# Patient Record
Sex: Male | Born: 1955
Health system: Southern US, Community
[De-identification: ages and names within clinical notes are randomized; demographics above are authoritative.]

## PROBLEM LIST (undated history)

## (undated) DIAGNOSIS — J189 Pneumonia, unspecified organism: Secondary | ICD-10-CM

## (undated) DIAGNOSIS — G4730 Sleep apnea, unspecified: Secondary | ICD-10-CM

## (undated) DIAGNOSIS — Z8041 Family history of malignant neoplasm of ovary: Secondary | ICD-10-CM

## (undated) DIAGNOSIS — K219 Gastro-esophageal reflux disease without esophagitis: Secondary | ICD-10-CM

## (undated) DIAGNOSIS — C61 Malignant neoplasm of prostate: Secondary | ICD-10-CM

## (undated) DIAGNOSIS — Z8042 Family history of malignant neoplasm of prostate: Secondary | ICD-10-CM

## (undated) HISTORY — PX: OTHER SURGICAL HISTORY: SHX169

## (undated) HISTORY — PX: CHOLECYSTECTOMY: SHX55

## (undated) HISTORY — DX: Family history of malignant neoplasm of ovary: Z80.41

## (undated) HISTORY — DX: Malignant neoplasm of prostate: C61

## (undated) HISTORY — DX: Family history of malignant neoplasm of prostate: Z80.42

---

## 1999-03-11 ENCOUNTER — Inpatient Hospital Stay (HOSPITAL_COMMUNITY): Admission: EM | Admit: 1999-03-11 | Discharge: 1999-03-13 | Payer: Self-pay | Admitting: Emergency Medicine

## 2015-12-28 DIAGNOSIS — L738 Other specified follicular disorders: Secondary | ICD-10-CM | POA: Diagnosis not present

## 2015-12-28 DIAGNOSIS — L570 Actinic keratosis: Secondary | ICD-10-CM | POA: Diagnosis not present

## 2016-03-14 DIAGNOSIS — N529 Male erectile dysfunction, unspecified: Secondary | ICD-10-CM | POA: Diagnosis not present

## 2016-03-14 DIAGNOSIS — I10 Essential (primary) hypertension: Secondary | ICD-10-CM | POA: Diagnosis not present

## 2016-03-14 DIAGNOSIS — E782 Mixed hyperlipidemia: Secondary | ICD-10-CM | POA: Diagnosis not present

## 2016-03-14 DIAGNOSIS — F909 Attention-deficit hyperactivity disorder, unspecified type: Secondary | ICD-10-CM | POA: Diagnosis not present

## 2016-06-11 DIAGNOSIS — L821 Other seborrheic keratosis: Secondary | ICD-10-CM | POA: Diagnosis not present

## 2016-06-11 DIAGNOSIS — L570 Actinic keratosis: Secondary | ICD-10-CM | POA: Diagnosis not present

## 2016-06-11 DIAGNOSIS — D485 Neoplasm of uncertain behavior of skin: Secondary | ICD-10-CM | POA: Diagnosis not present

## 2016-06-11 DIAGNOSIS — Z85828 Personal history of other malignant neoplasm of skin: Secondary | ICD-10-CM | POA: Diagnosis not present

## 2016-09-14 DIAGNOSIS — J0410 Acute tracheitis without obstruction: Secondary | ICD-10-CM | POA: Diagnosis not present

## 2016-09-14 DIAGNOSIS — J069 Acute upper respiratory infection, unspecified: Secondary | ICD-10-CM | POA: Diagnosis not present

## 2016-09-19 DIAGNOSIS — H25043 Posterior subcapsular polar age-related cataract, bilateral: Secondary | ICD-10-CM | POA: Diagnosis not present

## 2016-09-19 DIAGNOSIS — H5213 Myopia, bilateral: Secondary | ICD-10-CM | POA: Diagnosis not present

## 2016-12-04 DIAGNOSIS — E782 Mixed hyperlipidemia: Secondary | ICD-10-CM | POA: Diagnosis not present

## 2016-12-04 DIAGNOSIS — E669 Obesity, unspecified: Secondary | ICD-10-CM | POA: Diagnosis not present

## 2016-12-04 DIAGNOSIS — F909 Attention-deficit hyperactivity disorder, unspecified type: Secondary | ICD-10-CM | POA: Diagnosis not present

## 2016-12-04 DIAGNOSIS — I10 Essential (primary) hypertension: Secondary | ICD-10-CM | POA: Diagnosis not present

## 2017-01-21 DIAGNOSIS — Z125 Encounter for screening for malignant neoplasm of prostate: Secondary | ICD-10-CM | POA: Diagnosis not present

## 2017-01-21 DIAGNOSIS — Z79899 Other long term (current) drug therapy: Secondary | ICD-10-CM | POA: Diagnosis not present

## 2017-01-21 DIAGNOSIS — I10 Essential (primary) hypertension: Secondary | ICD-10-CM | POA: Diagnosis not present

## 2017-01-21 DIAGNOSIS — R6882 Decreased libido: Secondary | ICD-10-CM | POA: Diagnosis not present

## 2017-01-21 DIAGNOSIS — E782 Mixed hyperlipidemia: Secondary | ICD-10-CM | POA: Diagnosis not present

## 2017-01-21 DIAGNOSIS — Z0000 Encounter for general adult medical examination without abnormal findings: Secondary | ICD-10-CM | POA: Diagnosis not present

## 2017-06-11 DIAGNOSIS — L570 Actinic keratosis: Secondary | ICD-10-CM | POA: Diagnosis not present

## 2017-06-11 DIAGNOSIS — D0421 Carcinoma in situ of skin of right ear and external auricular canal: Secondary | ICD-10-CM | POA: Diagnosis not present

## 2017-06-11 DIAGNOSIS — D225 Melanocytic nevi of trunk: Secondary | ICD-10-CM | POA: Diagnosis not present

## 2017-06-11 DIAGNOSIS — D0422 Carcinoma in situ of skin of left ear and external auricular canal: Secondary | ICD-10-CM | POA: Diagnosis not present

## 2017-06-11 DIAGNOSIS — D485 Neoplasm of uncertain behavior of skin: Secondary | ICD-10-CM | POA: Diagnosis not present

## 2017-06-11 DIAGNOSIS — L821 Other seborrheic keratosis: Secondary | ICD-10-CM | POA: Diagnosis not present

## 2017-06-11 DIAGNOSIS — Z85828 Personal history of other malignant neoplasm of skin: Secondary | ICD-10-CM | POA: Diagnosis not present

## 2017-06-25 DIAGNOSIS — D0422 Carcinoma in situ of skin of left ear and external auricular canal: Secondary | ICD-10-CM | POA: Diagnosis not present

## 2017-09-17 DIAGNOSIS — G4700 Insomnia, unspecified: Secondary | ICD-10-CM | POA: Diagnosis not present

## 2017-09-17 DIAGNOSIS — E782 Mixed hyperlipidemia: Secondary | ICD-10-CM | POA: Diagnosis not present

## 2017-09-17 DIAGNOSIS — I10 Essential (primary) hypertension: Secondary | ICD-10-CM | POA: Diagnosis not present

## 2017-09-17 DIAGNOSIS — F909 Attention-deficit hyperactivity disorder, unspecified type: Secondary | ICD-10-CM | POA: Diagnosis not present

## 2017-09-24 DIAGNOSIS — H25043 Posterior subcapsular polar age-related cataract, bilateral: Secondary | ICD-10-CM | POA: Diagnosis not present

## 2017-09-24 DIAGNOSIS — H5213 Myopia, bilateral: Secondary | ICD-10-CM | POA: Diagnosis not present

## 2017-09-24 DIAGNOSIS — H35372 Puckering of macula, left eye: Secondary | ICD-10-CM | POA: Diagnosis not present

## 2017-12-24 DIAGNOSIS — Z85828 Personal history of other malignant neoplasm of skin: Secondary | ICD-10-CM | POA: Diagnosis not present

## 2017-12-24 DIAGNOSIS — L570 Actinic keratosis: Secondary | ICD-10-CM | POA: Diagnosis not present

## 2018-03-18 DIAGNOSIS — I10 Essential (primary) hypertension: Secondary | ICD-10-CM | POA: Diagnosis not present

## 2018-03-18 DIAGNOSIS — E782 Mixed hyperlipidemia: Secondary | ICD-10-CM | POA: Diagnosis not present

## 2018-03-18 DIAGNOSIS — Z0000 Encounter for general adult medical examination without abnormal findings: Secondary | ICD-10-CM | POA: Diagnosis not present

## 2018-03-18 DIAGNOSIS — F909 Attention-deficit hyperactivity disorder, unspecified type: Secondary | ICD-10-CM | POA: Diagnosis not present

## 2018-03-18 DIAGNOSIS — N529 Male erectile dysfunction, unspecified: Secondary | ICD-10-CM | POA: Diagnosis not present

## 2018-06-11 DIAGNOSIS — Z85828 Personal history of other malignant neoplasm of skin: Secondary | ICD-10-CM | POA: Diagnosis not present

## 2018-06-11 DIAGNOSIS — L821 Other seborrheic keratosis: Secondary | ICD-10-CM | POA: Diagnosis not present

## 2018-06-11 DIAGNOSIS — L570 Actinic keratosis: Secondary | ICD-10-CM | POA: Diagnosis not present

## 2018-06-11 DIAGNOSIS — L820 Inflamed seborrheic keratosis: Secondary | ICD-10-CM | POA: Diagnosis not present

## 2018-08-20 HISTORY — PX: COLONOSCOPY: SHX174

## 2018-09-30 DIAGNOSIS — H5213 Myopia, bilateral: Secondary | ICD-10-CM | POA: Diagnosis not present

## 2018-09-30 DIAGNOSIS — H2513 Age-related nuclear cataract, bilateral: Secondary | ICD-10-CM | POA: Diagnosis not present

## 2018-09-30 DIAGNOSIS — H35372 Puckering of macula, left eye: Secondary | ICD-10-CM | POA: Diagnosis not present

## 2019-05-05 DIAGNOSIS — I10 Essential (primary) hypertension: Secondary | ICD-10-CM | POA: Diagnosis not present

## 2019-05-05 DIAGNOSIS — F909 Attention-deficit hyperactivity disorder, unspecified type: Secondary | ICD-10-CM | POA: Diagnosis not present

## 2019-05-20 DIAGNOSIS — I10 Essential (primary) hypertension: Secondary | ICD-10-CM | POA: Diagnosis not present

## 2019-06-17 DIAGNOSIS — D225 Melanocytic nevi of trunk: Secondary | ICD-10-CM | POA: Diagnosis not present

## 2019-06-17 DIAGNOSIS — L821 Other seborrheic keratosis: Secondary | ICD-10-CM | POA: Diagnosis not present

## 2019-06-17 DIAGNOSIS — D485 Neoplasm of uncertain behavior of skin: Secondary | ICD-10-CM | POA: Diagnosis not present

## 2019-06-17 DIAGNOSIS — Z85828 Personal history of other malignant neoplasm of skin: Secondary | ICD-10-CM | POA: Diagnosis not present

## 2019-07-09 DIAGNOSIS — I10 Essential (primary) hypertension: Secondary | ICD-10-CM | POA: Diagnosis not present

## 2019-08-26 DIAGNOSIS — I10 Essential (primary) hypertension: Secondary | ICD-10-CM | POA: Diagnosis not present

## 2019-08-26 DIAGNOSIS — Z0000 Encounter for general adult medical examination without abnormal findings: Secondary | ICD-10-CM | POA: Diagnosis not present

## 2019-08-26 DIAGNOSIS — R7303 Prediabetes: Secondary | ICD-10-CM | POA: Diagnosis not present

## 2019-08-26 DIAGNOSIS — E782 Mixed hyperlipidemia: Secondary | ICD-10-CM | POA: Diagnosis not present

## 2019-08-26 DIAGNOSIS — F909 Attention-deficit hyperactivity disorder, unspecified type: Secondary | ICD-10-CM | POA: Diagnosis not present

## 2019-10-06 DIAGNOSIS — H25043 Posterior subcapsular polar age-related cataract, bilateral: Secondary | ICD-10-CM | POA: Diagnosis not present

## 2019-10-06 DIAGNOSIS — H35372 Puckering of macula, left eye: Secondary | ICD-10-CM | POA: Diagnosis not present

## 2019-10-06 DIAGNOSIS — H5213 Myopia, bilateral: Secondary | ICD-10-CM | POA: Diagnosis not present

## 2019-10-07 DIAGNOSIS — I10 Essential (primary) hypertension: Secondary | ICD-10-CM | POA: Diagnosis not present

## 2019-10-15 ENCOUNTER — Telehealth: Payer: Self-pay | Admitting: *Deleted

## 2019-10-15 NOTE — Telephone Encounter (Signed)
Received referral for patient from PCP Dr Josetta Huddle for HTN clinic with Dr Oval Linsey Left message to call back to get scheduled and find out what pharmacy patient uses

## 2019-10-16 NOTE — Telephone Encounter (Signed)
Moved patient to a HTN clinic appointment

## 2019-10-16 NOTE — Telephone Encounter (Signed)
Patient scheduled for 11/13/19 at 10:40am with Dr. Oval Linsey. Patient uses [facility] at [address], [place], [place]

## 2019-11-12 ENCOUNTER — Telehealth: Payer: Self-pay | Admitting: *Deleted

## 2019-11-12 NOTE — Telephone Encounter (Signed)
Left message for patient to call back, need to verify pharmacy

## 2019-11-13 ENCOUNTER — Ambulatory Visit: Payer: BC Managed Care – PPO | Admitting: Cardiovascular Disease

## 2019-11-13 ENCOUNTER — Other Ambulatory Visit: Payer: Self-pay

## 2019-11-13 ENCOUNTER — Encounter (INDEPENDENT_AMBULATORY_CARE_PROVIDER_SITE_OTHER): Payer: Self-pay

## 2019-11-13 ENCOUNTER — Encounter: Payer: Self-pay | Admitting: Cardiovascular Disease

## 2019-11-13 ENCOUNTER — Other Ambulatory Visit: Payer: Self-pay | Admitting: Cardiovascular Disease

## 2019-11-13 VITALS — BP 180/110 | HR 54 | Ht 71.0 in | Wt 238.0 lb

## 2019-11-13 DIAGNOSIS — E7800 Pure hypercholesterolemia, unspecified: Secondary | ICD-10-CM

## 2019-11-13 DIAGNOSIS — F909 Attention-deficit hyperactivity disorder, unspecified type: Secondary | ICD-10-CM

## 2019-11-13 DIAGNOSIS — I10 Essential (primary) hypertension: Secondary | ICD-10-CM | POA: Insufficient documentation

## 2019-11-13 DIAGNOSIS — F988 Other specified behavioral and emotional disorders with onset usually occurring in childhood and adolescence: Secondary | ICD-10-CM | POA: Insufficient documentation

## 2019-11-13 HISTORY — DX: Other specified behavioral and emotional disorders with onset usually occurring in childhood and adolescence: F98.8

## 2019-11-13 HISTORY — DX: Pure hypercholesterolemia, unspecified: E78.00

## 2019-11-13 HISTORY — DX: Essential (primary) hypertension: I10

## 2019-11-13 MED ORDER — NEBIVOLOL HCL 20 MG PO TABS: 20.0000 mg | ORAL_TABLET | Freq: Every day | ORAL | 1 refills | Status: DC

## 2019-11-13 MED ORDER — CLONIDINE 0.2 MG/24HR TD PTWK: 0.2000 mg | MEDICATED_PATCH | TRANSDERMAL | 12 refills | Status: DC

## 2019-11-13 NOTE — Addendum Note (Signed)
Addended by: Alvina Filbert B on: 11/13/2019 05:54 PM   Modules accepted: Orders

## 2019-11-13 NOTE — Progress Notes (Signed)
Hypertension Clinic Initial Assessment:    Date:  11/13/2019   ID:  Casey Hill, DOB 1955/09/20, MRN [id number]  PCP:  Josetta Huddle, MD  Cardiologist:  No primary care provider on file.  Nephrologist:  Referring MD: Josetta Huddle, MD   CC: Hypertension  History of Present Illness:    Casey Hill is a 64 y.o. male with a hx of hypertension and hyperlipidemia here to establish care in the hypertension clinic.  Casey Hill was first diagnosed with hypertension approximately 15 years ago.  His blood pressure was previously well-controlled.  However in [id number] Bystolic was discontinued.  He was started on metoprolol and his blood pressure was not well have been controlled.  Therefore his PCP switched it to bisoprolol and it still remains elevated.  When he checks it it is typically around [id number] to [id number] systolic.  He walks regularly for 30 minutes most days and has no exertional chest pain or shortness of breath.  He denies lower extremity edema, orthopnea, or PND.  He follows a healthy diet and tries to limit his sodium intake.  He has been limiting carbohydrate intake and eating more salads for lunch.  He limits red meat or pork to once per week.  He mostly cooks at home and does not eat out much.  He has never smoked and has approximately 1 glass of wine nightly.  He takes his morning medications at around 630 or 7 and his evening medications before going to bed.  He was diagnosed with ADD as an adult.  He has been on multiple different stimulant agents.  Most recently he has been taking dextroamphetamine.  He does not take it on the weekends.  Casey Hill reduced the dose to 5 mg daily from 10 mg he notes a difference in his ability to get through his day successfully since reducing the dose.  There has not been a significant change in his blood pressure on 5 mg versus 10 mg.  He does note that his blood pressure is better controlled on the weekends when he does not take it at all.  He does snore  and thinks he may have some apneic episodes.  He mostly feels rested in the mornings when he wakes up.  Previous antihypertensives: metoprolol  Past Medical History:  Diagnosis Date  . ADD (attention deficit disorder) 11/13/2019  . Essential hypertension 11/13/2019  . Pure hypercholesterolemia 11/13/2019    History reviewed. No pertinent surgical history.  Current Medications: Current Meds  Medication Sig  . amLODipine (NORVASC) 5 MG tablet Take 5 mg by mouth daily.  . cholecalciferol (VITAMIN D3) 25 MCG (1000 UNIT) tablet Take 1,000 Units by mouth daily.  Marland Kitchen dextroamphetamine (DEXTROSTAT) 5 MG tablet Take 5 mg by mouth daily.  . rosuvastatin (CRESTOR) 20 MG tablet Take 20 mg by mouth daily.  Marland Kitchen telmisartan-hydrochlorothiazide (MICARDIS HCT) 80-25 MG tablet Take 1 tablet by mouth daily.  Marland Kitchen zolpidem (AMBIEN) 5 MG tablet Take 5 mg by mouth at bedtime as needed for sleep.  . [DISCONTINUED] bisoprolol (ZEBETA) 10 MG tablet Take 10 mg by mouth 2 (two) times daily.  . [DISCONTINUED] cloNIDine (CATAPRES) 0.3 MG tablet Take 0.3 mg by mouth at bedtime.  . [DISCONTINUED] metoprolol succinate (TOPROL-XL) 50 MG 24 hr tablet Take 50 mg by mouth daily.     Allergies:   Patient has no known allergies.   Social History   Socioeconomic History  . Marital status: Married    Spouse name: Not on  file  . Number of children: Not on file  . Years of education: Not on file  . Highest education level: Not on file  Occupational History  . Not on file  Tobacco Use  . Smoking status: Not on file  Substance and Sexual Activity  . Alcohol use: Not on file  . Drug use: Not on file  . Sexual activity: Not on file  Other Topics Concern  . Not on file  Social History Narrative  . Not on file   Social Determinants of Health   Financial Resource Strain:   . Difficulty of Paying Living Expenses:   Food Insecurity:   . Worried About Charity fundraiser in the Last Year:   . Arboriculturist in the Last  Year:   Transportation Needs:   . Film/video editor (Medical):   Marland Kitchen Lack of Transportation (Non-Medical):   Physical Activity:   . Days of Exercise per Week:   . Minutes of Exercise per Session:   Stress:   . Feeling of Stress :   Social Connections:   . Frequency of Communication with Friends and Family:   . Frequency of Social Gatherings with Friends and Family:   . Attends Religious Services:   . Active Member of Clubs or Organizations:   . Attends Archivist Meetings:   Marland Kitchen Marital Status:      Family History: The patient's family history includes Glaucoma in his father and mother; Healthy in his brother; Heart attack in his father; Hyperlipidemia in his father; Hypertension in his father; Prostate cancer in his father; Stroke in his mother.  ROS:   Please see the history of present illness.    All other systems reviewed and are negative.  EKGs/Labs/Other Studies Reviewed:    EKG:  EKG is not ordered today.    Recent Labs: No results found for requested labs within last 8760 hours.   Recent Lipid Panel No results found for: CHOL, TRIG, HDL, CHOLHDL, VLDL, LDLCALC, LDLDIRECT  Physical Exam:    VS:  BP (!) 180/110   Pulse (!) 54   Ht 5\' 11"  (1.803 m)   Wt 238 lb (108 kg)   SpO2 96%   BMI 33.19 kg/m     Wt Readings from Last 3 Encounters:  11/13/19 238 lb (108 kg)    VS:  BP (!) 180/110   Pulse (!) 54   Ht 5\' 11"  (1.803 m)   Wt 238 lb (108 kg)   SpO2 96%   BMI 33.19 kg/m  , BMI Body mass index is 33.19 kg/m. GENERAL:  Well appearing HEENT: Pupils equal round and reactive, fundi not visualized, oral mucosa unremarkable NECK:  No jugular venous distention, waveform within normal limits, carotid upstroke brisk and symmetric, no bruits LUNGS:  Clear to auscultation bilaterally HEART:  RRR.  PMI not displaced or sustained,S1 and S2 within normal limits, no S3, no S4, no clicks, no rubs, no murmurs ABD:  Flat, positive bowel sounds normal in  frequency in pitch, no bruits, no rebound, no guarding, no midline pulsatile mass, no hepatomegaly, no splenomegaly EXT:  2 plus pulses throughout, no edema, no cyanosis no clubbing SKIN:  No rashes no nodules NEURO:  Cranial nerves II through XII grossly intact, motor grossly intact throughout PSYCH:  Cognitively intact, oriented to person place and time   ASSESSMENT:    1. Hypertension, unspecified type   2. Essential hypertension   3. Pure hypercholesterolemia   4. Attention  deficit hyperactivity disorder (ADHD), unspecified ADHD type     PLAN:    # Essential hypertension: Casey Hill's BP was elevated both initially and on repeat.  It is also been elevated at home.  Given that there is no significant change after reducing his ADD medication, I think it is okay to go back to 5 to 10 mg a day.  I suspect that the clonidine at night may be causing him to have some spikes and troughs in his blood pressure.  He gets sleepy with clonidine and does not like taking it during the day.  We will switch this to a clonidine patch 0.2 mg daily.  Continue amlodipine.  His blood pressure has not been well controlled since she stopped Bystolic.  He has tried to beta-blockers and neither worked.  We will now try putting him back on Bystolic 20 mg daily.  He was given a patient co-pay card today.  Continue telmisartan/HCTZ.  He will track his blood pressure at home twice daily.  He was enrolled in our remote patient monitoring system and will be referred for the prep program through the [facility].  We will also check renal artery Dopplers.   # ADD: Increase dextroamphetamine as above.  # Hyperlipidemia: Continue rosuvastatin.   Disposition:    FU with MD/PharmD in 1 month    Medication Adjustments/Labs and Tests Ordered: Current medicines are reviewed at length with the patient today.  Concerns regarding medicines are outlined above.  No orders of the defined types were placed in this encounter.  Meds  ordered this encounter  Medications  . cloNIDine (CATAPRES - DOSED IN MG/24 HR) 0.2 mg/24hr patch    Sig: Place 1 patch (0.2 mg total) onto the skin once a week.    Dispense:  4 patch    Refill:  12    D/C CLONIDINE ORAL  . Nebivolol HCl 20 MG TABS    Sig: Take 1 tablet (20 mg total) by mouth daily.    Dispense:  90 tablet    Refill:  1    D/C BISOPROLOL     Signed, Skeet Latch, MD  11/13/2019 5:30 PM    [place]

## 2019-11-13 NOTE — Patient Instructions (Addendum)
Medication Instructions:  STOP BISOPROLOL   STOP CLONIDINE ORAL MEDICATIONS   START BYSTOLIC 20 MG DAILY   START CLONIDINE 0.2 MG WEEKLY    Labwork: NONE    Testing/Procedures: Your physician has requested that you have a renal artery duplex. During this test, an ultrasound is used to evaluate blood flow to the kidneys. Allow one hour for this exam. Do not eat after midnight the day before and avoid carbonated beverages. Take your medications as you usually do.  Follow-Up: Your physician recommends that you schedule a follow-up appointment in: [place] D 12/16/2019 AT 2:00 PM   Your physician recommends that you schedule a follow-up appointment in: 4 MONTHS WITH DR Cleveland will receive a phone call from the PREP exercise and nutrition program to schedule an initial assessment.  Special Instructions: OK TO INCREASE YOUR DEXTROSTAT TO 10 MG   MONITOR AND LOG YOUR BLOOD PRESSURE TWICE A DAY. BRING YOUR LOG WITH YOU TO FOLLOW UP IN 1 MONTH   [email or web address] FOR COPAY CARD   DASH Eating Plan DASH stands for "Dietary Approaches to Stop Hypertension." The DASH eating plan is a healthy eating plan that has been shown to reduce high blood pressure (hypertension). It may also reduce your risk for type 2 diabetes, heart disease, and stroke. The DASH eating plan may also help with weight loss. What are tips for following this plan?  General guidelines  Avoid eating more than 2,300 mg (milligrams) of salt (sodium) a day. If you have hypertension, you may need to reduce your sodium intake to 1,500 mg a day.  Limit alcohol intake to no more than 1 drink a day for nonpregnant women and 2 drinks a day for men. One drink equals 12 oz of beer, 5 oz of wine, or 1 oz of hard liquor.  Work with your health care provider to maintain a healthy body weight or to lose weight. Ask what an ideal weight is for you.  Get at least 30 minutes of  exercise that causes your heart to beat faster (aerobic exercise) most days of the week. Activities may include walking, swimming, or biking.  Work with your health care provider or diet and nutrition specialist (dietitian) to adjust your eating plan to your individual calorie needs. Reading food labels   Check food labels for the amount of sodium per serving. Choose foods with less than 5 percent of the Daily Value of sodium. Generally, foods with less than 300 mg of sodium per serving fit into this eating plan.  To find whole grains, look for the word "whole" as the first word in the ingredient list. Shopping  Buy products labeled as "low-sodium" or "no salt added."  Buy fresh foods. Avoid canned foods and premade or frozen meals. Cooking  Avoid adding salt when cooking. Use salt-free seasonings or herbs instead of table salt or sea salt. Check with your health care provider or pharmacist before using salt substitutes.  Do not fry foods. Cook foods using healthy methods such as baking, boiling, grilling, and broiling instead.  Cook with heart-healthy oils, such as olive, canola, soybean, or sunflower oil. Meal planning  Eat a balanced diet that includes: ? 5 or more servings of fruits and vegetables each day. At each meal, try to fill half of your plate with fruits and vegetables. ? Up to 6-8 servings of whole grains each day. ? Less than 6 oz of lean meat,  poultry, or fish each day. A 3-oz serving of meat is about the same size as a deck of cards. One egg equals 1 oz. ? 2 servings of low-fat dairy each day. ? A serving of nuts, seeds, or beans 5 times each week. ? Heart-healthy fats. Healthy fats called Omega-3 fatty acids are found in foods such as flaxseeds and coldwater fish, like sardines, salmon, and mackerel.  Limit how much you eat of the following: ? Canned or prepackaged foods. ? Food that is high in trans fat, such as fried foods. ? Food that is high in saturated fat,  such as fatty meat. ? Sweets, desserts, sugary drinks, and other foods with added sugar. ? Full-fat dairy products.  Do not salt foods before eating.  Try to eat at least 2 vegetarian meals each week.  Eat more home-cooked food and less restaurant, buffet, and fast food.  When eating at a restaurant, ask that your food be prepared with less salt or no salt, if possible. What foods are recommended? The items listed may not be a complete list. Talk with your dietitian about what dietary choices are best for you. Grains Whole-grain or whole-wheat bread. Whole-grain or whole-wheat pasta. Brown rice. [place] Morrow. Bulgur. Whole-grain and low-sodium cereals. Pita bread. Low-fat, low-sodium crackers. Whole-wheat flour tortillas. Vegetables Fresh or frozen vegetables (raw, steamed, roasted, or grilled). Low-sodium or reduced-sodium tomato and vegetable juice. Low-sodium or reduced-sodium tomato sauce and tomato paste. Low-sodium or reduced-sodium canned vegetables. Fruits All fresh, dried, or frozen fruit. Canned fruit in natural juice (without added sugar). Meat and other protein foods Skinless chicken or [place]. Ground chicken or [place]. Pork with fat trimmed off. Fish and seafood. Egg whites. Dried beans, peas, or lentils. Unsalted nuts, nut butters, and seeds. Unsalted canned beans. Lean cuts of beef with fat trimmed off. Low-sodium, lean deli meat. Dairy Low-fat (1%) or fat-free (skim) milk. Fat-free, low-fat, or reduced-fat cheeses. Nonfat, low-sodium ricotta or cottage cheese. Low-fat or nonfat yogurt. Low-fat, low-sodium cheese. Fats and oils Soft margarine without trans fats. Vegetable oil. Low-fat, reduced-fat, or light mayonnaise and salad dressings (reduced-sodium). Canola, safflower, olive, soybean, and sunflower oils. Avocado. Seasoning and other foods Herbs. Spices. Seasoning mixes without salt. Unsalted popcorn and pretzels. Fat-free sweets. What foods are not recommended? The  items listed may not be a complete list. Talk with your dietitian about what dietary choices are best for you. Grains Baked goods made with fat, such as croissants, muffins, or some breads. Dry pasta or rice meal packs. Vegetables Creamed or fried vegetables. Vegetables in a cheese sauce. Regular canned vegetables (not low-sodium or reduced-sodium). Regular canned tomato sauce and paste (not low-sodium or reduced-sodium). Regular tomato and vegetable juice (not low-sodium or reduced-sodium). Angie Fava. Olives. Fruits Canned fruit in a light or heavy syrup. Fried fruit. Fruit in cream or butter sauce. Meat and other protein foods Fatty cuts of meat. Ribs. Fried meat. Berniece Salines. Sausage. Bologna and other processed lunch meats. Salami. Fatback. Hotdogs. Bratwurst. Salted nuts and seeds. Canned beans with added salt. Canned or smoked fish. Whole eggs or egg yolks. Chicken or [place] with skin. Dairy Whole or 2% milk, cream, and half-and-half. Whole or full-fat cream cheese. Whole-fat or sweetened yogurt. Full-fat cheese. Nondairy creamers. Whipped toppings. Processed cheese and cheese spreads. Fats and oils Butter. Stick margarine. Lard. Shortening. Ghee. Bacon fat. Tropical oils, such as coconut, palm kernel, or palm oil. Seasoning and other foods Salted popcorn and pretzels. Onion salt, garlic salt, seasoned salt, table salt, and  sea salt. Worcestershire sauce. Tartar sauce. Barbecue sauce. Teriyaki sauce. Soy sauce, including reduced-sodium. Steak sauce. Canned and packaged gravies. Fish sauce. Oyster sauce. Cocktail sauce. Horseradish that you find on the shelf. Ketchup. Mustard. Meat flavorings and tenderizers. Bouillon cubes. Hot sauce and Tabasco sauce. Premade or packaged marinades. Premade or packaged taco seasonings. Relishes. Regular salad dressings. Where to find more information:  National Heart, Lung, and [place]: https://wilson-eaton.com/  American Heart Association:  www.heart.org Summary  The DASH eating plan is a healthy eating plan that has been shown to reduce high blood pressure (hypertension). It may also reduce your risk for type 2 diabetes, heart disease, and stroke.  With the DASH eating plan, you should limit salt (sodium) intake to 2,300 mg a day. If you have hypertension, you may need to reduce your sodium intake to 1,500 mg a day.  When on the DASH eating plan, aim to eat more fresh fruits and vegetables, whole grains, lean proteins, low-fat dairy, and heart-healthy fats.  Work with your health care provider or diet and nutrition specialist (dietitian) to adjust your eating plan to your individual calorie needs. This information is not intended to replace advice given to you by your health care provider. Make sure you discuss any questions you have with your health care provider. Document Released: 07/26/2011 Document Revised: 07/19/2017 Document Reviewed: 07/30/2016 Elsevier Patient Education  2020 [employer].

## 2019-11-13 NOTE — Addendum Note (Signed)
Addended by: Skeet Latch C on: 11/13/2019 05:55 PM   Modules accepted: Orders

## 2019-11-13 NOTE — Telephone Encounter (Signed)
Spoke with patient and he will bring smart phone Per patient he uses [facility] in [place]

## 2019-11-16 ENCOUNTER — Telehealth: Payer: Self-pay | Admitting: Cardiovascular Disease

## 2019-11-16 NOTE — Telephone Encounter (Signed)
Left message for patient to call and schedule renal artery duplex ordered by Dr. Oval Linsey

## 2019-11-17 ENCOUNTER — Telehealth: Payer: Self-pay

## 2019-11-17 NOTE — Telephone Encounter (Signed)
Left message for call back reference referral to [facility].

## 2019-11-18 DIAGNOSIS — Z1159 Encounter for screening for other viral diseases: Secondary | ICD-10-CM | POA: Diagnosis not present

## 2019-11-19 ENCOUNTER — Encounter (HOSPITAL_COMMUNITY): Payer: BC Managed Care – PPO

## 2019-11-23 ENCOUNTER — Telehealth: Payer: Self-pay | Admitting: Cardiovascular Disease

## 2019-11-23 DIAGNOSIS — Z1211 Encounter for screening for malignant neoplasm of colon: Secondary | ICD-10-CM | POA: Diagnosis not present

## 2019-11-23 DIAGNOSIS — D122 Benign neoplasm of ascending colon: Secondary | ICD-10-CM | POA: Diagnosis not present

## 2019-11-23 DIAGNOSIS — D123 Benign neoplasm of transverse colon: Secondary | ICD-10-CM | POA: Diagnosis not present

## 2019-11-23 DIAGNOSIS — D124 Benign neoplasm of descending colon: Secondary | ICD-10-CM | POA: Diagnosis not present

## 2019-11-23 DIAGNOSIS — K5730 Diverticulosis of large intestine without perforation or abscess without bleeding: Secondary | ICD-10-CM | POA: Diagnosis not present

## 2019-11-23 DIAGNOSIS — K635 Polyp of colon: Secondary | ICD-10-CM | POA: Diagnosis not present

## 2019-11-23 NOTE — Telephone Encounter (Signed)
Spoke with patient. Pharmacy reports prior authorization is needed for Bystolic. Will route to primary nurse to complete. Patient would like a call with updates after the prior Josem Kaufmann has been done.

## 2019-11-23 NOTE — Telephone Encounter (Signed)
New Message    Pt is calling and says the pharmacy would not give him the medication BYSTOLIC because they need prior Authorization    Please advise

## 2019-11-23 NOTE — Telephone Encounter (Signed)
BP still running high.  I know we are working on the [employer].  In the meantime, do we have any samples?  If not, let's have him increase amlodipine to 10mg .   ~TCR

## 2019-11-24 NOTE — Telephone Encounter (Signed)
**Note De-Identified Tyronn Golda Obfuscation** FYI: I did the Bystolic PA through covermymeds and received an approval. KEY: [id number] No additional info available.  I did include the [facility] office fax # of [phone] in the [place] PA request with CMM so they can fax the approval letter (if any) to Dr Blenda Mounts nurse.  Can find approval message for Bystolic at [email or web address] or call them at [phone].

## 2019-11-25 ENCOUNTER — Ambulatory Visit (HOSPITAL_COMMUNITY)
Admission: RE | Admit: 2019-11-25 | Discharge: 2019-11-25 | Disposition: A | Discharge status: Home / Self Care | Payer: BC Managed Care – PPO | Source: Ambulatory Visit | Attending: Cardiology | Admitting: Cardiology

## 2019-11-25 ENCOUNTER — Other Ambulatory Visit: Payer: Self-pay

## 2019-11-25 DIAGNOSIS — I10 Essential (primary) hypertension: Secondary | ICD-10-CM | POA: Insufficient documentation

## 2019-11-25 NOTE — Telephone Encounter (Signed)
Thank you very much 

## 2019-11-27 ENCOUNTER — Other Ambulatory Visit: Payer: Self-pay

## 2019-12-02 ENCOUNTER — Telehealth: Payer: Self-pay | Admitting: Cardiovascular Disease

## 2019-12-02 NOTE — Telephone Encounter (Signed)
BP continues to be high on nebivolol.  Increase amlodipine to 10mg .

## 2019-12-09 NOTE — Telephone Encounter (Signed)
Hi Mr. [place],  I'm from the patient accounts dept.  The test you received is legitimate.  The allowed amount for the renal study done 11/25/2019 was applied to your deductible.  Receiving a text means that somewhere along the line you have opted out of paper billing. That can be reinstated by calling [phone], option 2.  You may pay at this number as well.  Please contact us if you have any further questions at [phone], option 7.

## 2019-12-11 ENCOUNTER — Telehealth: Payer: Self-pay | Admitting: *Deleted

## 2019-12-11 NOTE — Telephone Encounter (Signed)
Left message to call back  

## 2019-12-11 NOTE — Telephone Encounter (Signed)
-----   Message from Skeet Latch, MD sent at 12/11/2019  7:49 AM EDT ----- Please have him increase amlodipine to 10mg .  Thanks.

## 2019-12-12 DIAGNOSIS — I10 Essential (primary) hypertension: Secondary | ICD-10-CM | POA: Diagnosis not present

## 2019-12-16 ENCOUNTER — Other Ambulatory Visit: Payer: Self-pay

## 2019-12-16 ENCOUNTER — Ambulatory Visit (INDEPENDENT_AMBULATORY_CARE_PROVIDER_SITE_OTHER): Payer: BC Managed Care – PPO | Admitting: Pharmacist

## 2019-12-16 VITALS — BP 146/88 | HR 60 | Temp 97.5°F | Resp 15 | Ht 71.0 in | Wt 239.0 lb

## 2019-12-16 DIAGNOSIS — I10 Essential (primary) hypertension: Secondary | ICD-10-CM | POA: Diagnosis not present

## 2019-12-16 NOTE — Patient Instructions (Addendum)
Return for a  follow up appointment in 4 weeks  Check your blood pressure at home daily (if able) and keep record of the readings.  Take your BP meds as follows: INCREASE amlodipine to 10mg  daily (okay to take 2 tablets of 5mg  every day until gone)  INCREASE hydration - more water* Decerase sodium in diet with low sodium salad dressing, and low sodium bread*  Bring all of your meds, your BP cuff and your record of home blood pressures to your next appointment.  Exercise as you're able, try to walk approximately 30 minutes per day.  Keep salt intake to a minimum, especially watch canned and prepared boxed foods.  Eat more fresh fruits and vegetables and fewer canned items.  Avoid eating in fast food restaurants.    HOW TO TAKE YOUR BLOOD PRESSURE: . Rest 5 minutes before taking your blood pressure. .  Don't smoke or drink caffeinated beverages for at least 30 minutes before. . Take your blood pressure before (not after) you eat. . Sit comfortably with your back supported and both feet on the floor (don't cross your legs). . Elevate your arm to heart level on a table or a desk. . Use the proper sized cuff. It should fit smoothly and snugly around your bare upper arm. There should be enough room to slip a fingertip under the cuff. The bottom edge of the cuff should be 1 inch above the crease of the elbow. . Ideally, take 3 measurements at one sitting and record the average.

## 2019-12-16 NOTE — Progress Notes (Signed)
Patient ID: Casey Hill                 DOB: [birth year]                      MRN: [id number]     HPI: Casey Hill is a 64 y.o. male referred by Dr. Oval Hill to HTN clinic. PMH includes hypertension for over 15 years, hyperlipidemia, hx of tachycardia, and ADD. Current ADD medication is dextroamphetamine 10mg  daily. Clonidine was changed from 0.2mg  tablets to weekly patch and  Bystolic 20mg  was dded to therapy. Per Vivify monitoring , Dr Casey Hill increased his amlodipine to 10mg  daily, but patient never received notifications and still on amlodipine 5mg  daily.  Noted he is not following a low sodium diet but reports compliance with physical activity and medication therapy.  Current HTN meds:  Amlodipine 5mg  daily  Clonidine 0.2mg  patch every week (Fridays) - 1st dose 3 weeks ago Bystolic 20mg  daily (11/27/2019) -  Telmisartan/HCT 80-25mg  daily (monring)   BP goal: 130/80  Family History: The patient's family history includes Glaucoma in his father and mother; Healthy in his brother; Heart attack in his father; Hyperlipidemia in his father; Hypertension in his father; Prostate cancer in his father; Stroke in his mother.  Social History: denies tobacco use, drink  Diet: healthy diet with limited sodium but continues to eat salads with LOTS of dressing, limited amount of red meat or pork  Exercise: PREP program - not scheduled yet; patient is walking 30 minutes 4x/week at home  Home BP readings:  10 readings; average 158/98 (see vivify report)  Wt Readings from Last 3 Encounters:  12/16/19 239 lb (108.4 kg)  11/13/19 238 lb (108 kg)   BP Readings from Last 3 Encounters:  12/16/19 (!) 146/88  11/13/19 (!) 180/110   Pulse Readings from Last 3 Encounters:  12/16/19 60  11/13/19 (!) 54     Past Medical History:  Diagnosis Date  . ADD (attention deficit disorder) 11/13/2019  . Essential hypertension 11/13/2019  . Pure hypercholesterolemia 11/13/2019    Current Outpatient  Medications on File Prior to Visit  Medication Sig Dispense Refill  . amLODipine (NORVASC) 5 MG tablet Take 10 mg by mouth daily.    . cholecalciferol (VITAMIN D3) 25 MCG (1000 UNIT) tablet Take 1,000 Units by mouth daily.    . cloNIDine (CATAPRES - DOSED IN MG/24 HR) 0.2 mg/24hr patch Place 1 patch (0.2 mg total) onto the skin once a week. 4 patch 12  . dextroamphetamine (DEXTROSTAT) 5 MG tablet Take 5 mg by mouth daily.    . Nebivolol HCl 20 MG TABS Take 1 tablet (20 mg total) by mouth daily. 90 tablet 1  . Omega-3 Fatty Acids (FISH OIL) 1000 MG CAPS Take 1,000 mg by mouth in the morning and at bedtime.    . rosuvastatin (CRESTOR) 20 MG tablet Take 20 mg by mouth daily.    Marland Kitchen telmisartan-hydrochlorothiazide (MICARDIS HCT) 80-25 MG tablet Take 1 tablet by mouth daily.    Marland Kitchen zolpidem (AMBIEN) 5 MG tablet Take 5 mg by mouth at bedtime as needed for sleep.     No current facility-administered medications on file prior to visit.    No Known Allergies  Blood pressure (!) 146/88, pulse 60, temperature (!) 97.5 F (36.4 C), resp. rate 15, height 5\' 11"  (1.803 m), weight 239 lb (108.4 kg), SpO2 96 %.  Essential hypertension Blood pressure remains above goal.. On initial BP measuring we  noticed systolic BP 123456 higher, but after talking and resting for 10 minutes, BP dropped to146/88.   Casey Hill is [facility] and completes his daily assessments with [facility]. Patient stated he uses his arm cuff significantly tighter than in clinic and always check his blood pressure before taking his medication.  We spent significant amount of time discussing appropriate BP measuring, and options to decrease sodium in diet (low sodium bread, low sodium salad dressing, less take out food, and low sodium deli meat). Patient was also encouraged to increase water intake.  Will increase amlodipine dose to 10mg  daily and continue daily monitoring. Plan to add spironolactone 25mg  to his regimen  if additional BP control needed. May consider changing HCTZ to chlorthalidone after adding spironolactone.  Casey Hill PharmD, BCPS, [place] [id number] 12/20/2019 2:56 PM

## 2019-12-17 NOTE — Telephone Encounter (Signed)
Patient seen by Pharm D 4/28 and medication increased

## 2019-12-20 ENCOUNTER — Encounter: Payer: Self-pay | Admitting: Pharmacist

## 2019-12-20 NOTE — Assessment & Plan Note (Signed)
Blood pressure remains above goal.. On initial BP measuring we noticed systolic BP 123456 higher, but after talking and resting for 10 minutes, BP dropped to146/88.   Casey Hill is part of [facility] and completes his daily assessments with [facility]. Patient stated he uses his arm cuff significantly tighter than in clinic and always check his blood pressure before taking his medication.  We spent significant amount of time discussing appropriate BP measuring, and options to decrease sodium in diet (low sodium bread, low sodium salad dressing, less take out food, and low sodium deli meat). Patient was also encouraged to increase water intake.  Will increase amlodipine dose to 10mg  daily and continue daily monitoring. Plan to add spironolactone 25mg  to his regimen if additional BP control needed. May consider changing HCTZ to chlorthalidone after adding spironolactone.

## 2020-01-11 DIAGNOSIS — I10 Essential (primary) hypertension: Secondary | ICD-10-CM | POA: Diagnosis not present

## 2020-01-12 ENCOUNTER — Ambulatory Visit (INDEPENDENT_AMBULATORY_CARE_PROVIDER_SITE_OTHER): Payer: BC Managed Care – PPO | Admitting: Pharmacist

## 2020-01-12 ENCOUNTER — Encounter: Payer: Self-pay | Admitting: Pharmacist

## 2020-01-12 ENCOUNTER — Other Ambulatory Visit: Payer: Self-pay

## 2020-01-12 VITALS — BP 134/78 | HR 40 | Temp 98.0°F | Ht 71.0 in | Wt 235.2 lb

## 2020-01-12 DIAGNOSIS — I10 Essential (primary) hypertension: Secondary | ICD-10-CM

## 2020-01-12 MED ORDER — SPIRONOLACTONE 25 MG PO TABS
25.0000 mg | ORAL_TABLET | Freq: Every day | ORAL | 1 refills | Status: DC
Start: 2020-01-12 — End: 2020-02-16

## 2020-01-12 MED ORDER — AMLODIPINE BESYLATE 10 MG PO TABS: 10.0000 mg | ORAL_TABLET | Freq: Every day | ORAL | 1 refills | Status: DC

## 2020-01-12 MED ORDER — NEBIVOLOL HCL 20 MG PO TABS: 10.0000 mg | ORAL_TABLET | Freq: Every day | ORAL | 1 refills | Status: DC

## 2020-01-12 NOTE — Assessment & Plan Note (Signed)
Blood pressure remains above goal , but significantly improved. Noted baseline K = 3.7 and HR between 40 and low 50s bpm. Will decrease Bystolic to 10mg  daily and add spironolactone 25mg  daily to therapy. Patient is to repeat BMET in 2 week, continue daily BP monitoring , and follow up at HTN clinic in 4 weeks.

## 2020-01-12 NOTE — Patient Instructions (Addendum)
Return for a  follow up appointment in 4 weeks  Go to the lab in 2 weeks  Check your blood pressure at home daily (if able) and keep record of the readings.  Take your BP meds as follows: * DECREASE Bystolic to 10mg  daily - okay to take 1/2 of 20 mg tablet* *START taking spironolactone 25mg  daily*   Bring all of your meds, your BP cuff and your record of home blood pressures to your next appointment.  Exercise as you're able, try to walk approximately 30 minutes per day.  Keep salt intake to a minimum, especially watch canned and prepared boxed foods.  Eat more fresh fruits and vegetables and fewer canned items.  Avoid eating in fast food restaurants.    HOW TO TAKE YOUR BLOOD PRESSURE: . Rest 5 minutes before taking your blood pressure. .  Don't smoke or drink caffeinated beverages for at least 30 minutes before. . Take your blood pressure before (not after) you eat. . Sit comfortably with your back supported and both feet on the floor (don't cross your legs). . Elevate your arm to heart level on a table or a desk. . Use the proper sized cuff. It should fit smoothly and snugly around your bare upper arm. There should be enough room to slip a fingertip under the cuff. The bottom edge of the cuff should be 1 inch above the crease of the elbow. . Ideally, take 3 measurements at one sitting and record the average.

## 2020-01-12 NOTE — Progress Notes (Signed)
Patient ID: PRABHJOT BEGER                 DOB: [birth year]                      MRN: [id number]     HPI: Casey Hill is a 64 y.o. male referred by Dr. Oval Linsey to HTN clinic. PMH includes hypertension for over 15 years, hyperlipidemia, hx of tachycardia, and ADD. Current ADD medication is dextroamphetamine 10mg  daily. Clonidine was changed from 0.2mg  tablets to weekly patch and  Bystolic 20mg  was dded to therapy. Per Vivify monitoring blood pressures are better controlled except for 1 readings of 162/102 on 01/11/2020. Patient explained BP was takend while at work and after lack of sleep. Also noted pulse consistently in the 40s. Patient denies dizziness, increased fatigue, HA's or blurry vision. Reports some swelling of ankles but nothing persistent of bothersome.   Current HTN meds:  Amlodipine 10mg  daily  Clonidine 0.2mg  patch every week  Bystolic 20mg  daily Telmisartan/HCT 80-25mg  daily (AM)    BP goal: 130/80  Family History: The patient's family history includes Glaucoma in his father and mother; Healthy in his brother; Heart attack in his father; Hyperlipidemia in his father; Hypertension in his father; Prostate cancer in his father; Stroke in his mother.  Social History: denies tobacco use, drink  Diet: healthy diet with limited sodium but continues to eat salads with LOTS of dressing, limited amount of red meat or pork  Exercise: PREP program - not scheduled yet; patient is walking 30 minutes 4x/week at home  Home BP readings:  7 readings; average 141/89; HR range 43-53bpm (see vivify report)   Wt Readings from Last 3 Encounters:  01/12/20 235 lb 3.2 oz (106.7 kg)  12/16/19 239 lb (108.4 kg)  11/13/19 238 lb (108 kg)   BP Readings from Last 3 Encounters:  01/12/20 134/78  12/16/19 (!) 146/88  11/13/19 (!) 180/110   Pulse Readings from Last 3 Encounters:  01/12/20 (!) 40  12/16/19 60  11/13/19 (!) 54    Past Medical History:  Diagnosis Date  . ADD (attention  deficit disorder) 11/13/2019  . Essential hypertension 11/13/2019  . Pure hypercholesterolemia 11/13/2019    Current Outpatient Medications on File Prior to Visit  Medication Sig Dispense Refill  . cholecalciferol (VITAMIN D3) 25 MCG (1000 UNIT) tablet Take 1,000 Units by mouth daily.    . cloNIDine (CATAPRES - DOSED IN MG/24 HR) 0.2 mg/24hr patch Place 1 patch (0.2 mg total) onto the skin once a week. 4 patch 12  . dextroamphetamine (DEXTROSTAT) 5 MG tablet Take 5 mg by mouth daily.    . Omega-3 Fatty Acids (FISH OIL) 1000 MG CAPS Take 1,000 mg by mouth in the morning and at bedtime.    . rosuvastatin (CRESTOR) 20 MG tablet Take 20 mg by mouth daily.    Marland Kitchen telmisartan-hydrochlorothiazide (MICARDIS HCT) 80-25 MG tablet Take 1 tablet by mouth daily.    Marland Kitchen zolpidem (AMBIEN) 5 MG tablet Take 5 mg by mouth at bedtime as needed for sleep.     No current facility-administered medications on file prior to visit.    No Known Allergies  Blood pressure 134/78, pulse (!) 40, temperature 98 F (36.7 C), height 5\' 11"  (1.803 m), weight 235 lb 3.2 oz (106.7 kg), SpO2 98 %.  Essential hypertension Blood pressure remains above goal , but significantly improved. Noted baseline K = 3.7 and HR between 40 and low 50s bpm.  Will decrease Bystolic to 10mg  daily and add spironolactone 25mg  daily to therapy. Patient is to repeat BMET in 2 week, continue daily BP monitoring , and follow up at HTN clinic in 4 weeks.    Shalondra Wunschel Rodriguez-Guzman PharmD, BCPS, [place] [id number] 01/12/2020 5:19 PM

## 2020-01-14 DIAGNOSIS — L570 Actinic keratosis: Secondary | ICD-10-CM | POA: Diagnosis not present

## 2020-02-10 DIAGNOSIS — I10 Essential (primary) hypertension: Secondary | ICD-10-CM | POA: Diagnosis not present

## 2020-02-16 ENCOUNTER — Telehealth: Payer: Self-pay

## 2020-02-16 ENCOUNTER — Other Ambulatory Visit: Payer: Self-pay

## 2020-02-16 ENCOUNTER — Ambulatory Visit (INDEPENDENT_AMBULATORY_CARE_PROVIDER_SITE_OTHER): Payer: BC Managed Care – PPO | Admitting: Pharmacist Clinician (PhC)/ Clinical Pharmacy Specialist

## 2020-02-16 VITALS — BP 154/94 | HR 58 | Ht 71.0 in | Wt 233.0 lb

## 2020-02-16 DIAGNOSIS — I10 Essential (primary) hypertension: Secondary | ICD-10-CM | POA: Diagnosis not present

## 2020-02-16 MED ORDER — CLONIDINE 0.2 MG/24HR TD PTWK: 0.2000 mg | MEDICATED_PATCH | TRANSDERMAL | 4 refills | Status: DC

## 2020-02-16 MED ORDER — SPIRONOLACTONE 25 MG PO TABS
50.0000 mg | ORAL_TABLET | Freq: Every day | ORAL | 1 refills | Status: DC
Start: 2020-02-16 — End: 2020-08-23

## 2020-02-16 NOTE — Patient Instructions (Signed)
Return for a a follow up appointment with Dr. Oval Linsey or Coletta Memos - we will call you to schedule that appointment  Go to the lab in 2 weeks   Check your blood pressure at home daily and keep record of the readings.  Take your BP meds as follows:  Increase spironolactone to 50 mg once daily in the mornings.  Move amlodipine 10 mg to evenings   Bring all of your meds, your BP cuff and your record of home blood pressures to your next appointment.  Exercise as you're able, try to walk approximately 30 minutes per day.  Keep salt intake to a minimum, especially watch canned and prepared boxed foods.  Eat more fresh fruits and vegetables and fewer canned items.  Avoid eating in fast food restaurants.    HOW TO TAKE YOUR BLOOD PRESSURE: . Rest 5 minutes before taking your blood pressure. .  Don't smoke or drink caffeinated beverages for at least 30 minutes before. . Take your blood pressure before (not after) you eat. . Sit comfortably with your back supported and both feet on the floor (don't cross your legs). . Elevate your arm to heart level on a table or a desk. . Use the proper sized cuff. It should fit smoothly and snugly around your bare upper arm. There should be enough room to slip a fingertip under the cuff. The bottom edge of the cuff should be 1 inch above the crease of the elbow. . Ideally, take 3 measurements at one sitting and record the average.

## 2020-02-16 NOTE — Progress Notes (Signed)
Patient ID: Casey Hill                 DOB: 06-Jun-1956                      MRN: [id number]     HPI: Casey Hill is a 64 y.o. male referred by Dr. Oval Linsey to HTN clinic. PMH includes hypertension for over 15 years, hyperlipidemia, hx of tachycardia, and ADD. Current ADD medication is dextroamphetamine 10mg  daily. Clonidine was changed from 0.2mg  tablets to weekly patch and  Bystolic 20mg  was dded to therapy. Per Vivify monitoring blood pressures are better controlled except for 1 readings of 162/102 on 01/11/2020. Patient explained BP was takend while at work and after lack of sleep. Also noted pulse consistently in the 40s. Patient denies dizziness, increased fatigue, HA's or blurry vision. Reports some swelling of ankles but nothing persistent of bothersome.   At his last visit with Raquel Rodriguez-Guzman his blood pressure was 134/78.  This was better than his home readings, which at that time averaged 141/89.  Because of his low heart rate (40) the nebivolol was decreased from 20 mg to 10 mg and spironolactone 25 mg daily was added.  Since then he has not noticed a significant change in home readings, other than a slight increase in heart rate.  Denies problems with compliance, did ask for 90 day supply of meds to be sent to mail order pharmacy.   Current HTN meds:  Amlodipine 10mg  daily (am) Clonidine 0.2mg  patch every week (Monday) Bystolic 10mg  daily (dose decreased from 20 mg) (pm) Telmisartan/HCT 80-25mg  daily (am)   Spironolactone 25 mg qd (am)  BP goal: 130/80  Family History: The patient's family history includes Glaucoma in his father and mother; Healthy in his brother; Heart attack in his father; Hyperlipidemia in his father; Hypertension in his father; Prostate cancer in his father; Stroke in his mother.  Social History: denies tobacco use, drinks wine most day  Diet: healthy diet with limited sodium but continues to eat salads with LOTS of dressing, limited amount of  red meat or pork, mor chicken and fish  Exercise: PREP program - not scheduled yet; patient is walking 30 minutes 4x/week at home (had some foot issues, not som much this last week).  Will send message to PREP RN to see if we can get him registered  Home BP readings:  10 readings: average 145/92  (previous visit, 7 day average was 141/89),    Labs:  01/28/20: Na 141, K 4.5, Glu 116, BUN 23, SCr 0.86  10/23/19:  Na 144, K 3.7, Glu 127, BUN 17, SCr 0.85    Wt Readings from Last 3 Encounters:  02/16/20 233 lb (105.7 kg)  01/12/20 235 lb 3.2 oz (106.7 kg)  12/16/19 239 lb (108.4 kg)   BP Readings from Last 3 Encounters:  02/16/20 (!) 154/94  01/12/20 134/78  12/16/19 (!) 146/88   Pulse Readings from Last 3 Encounters:  02/16/20 (!) 58  01/12/20 (!) 40  12/16/19 60    Past Medical History:  Diagnosis Date  . ADD (attention deficit disorder) 11/13/2019  . Essential hypertension 11/13/2019  . Pure hypercholesterolemia 11/13/2019    Current Outpatient Medications on File Prior to Visit  Medication Sig Dispense Refill  . amLODipine (NORVASC) 10 MG tablet Take 1 tablet (10 mg total) by mouth daily. 90 tablet 1  . cholecalciferol (VITAMIN D3) 25 MCG (1000 UNIT) tablet Take 1,000 Units by mouth  daily.    . dextroamphetamine (DEXTROSTAT) 5 MG tablet Take 5 mg by mouth daily.    . Nebivolol HCl 20 MG TABS Take 0.5 tablets (10 mg total) by mouth daily. 90 tablet 1  . Omega-3 Fatty Acids (FISH OIL) 1000 MG CAPS Take 1,000 mg by mouth in the morning and at bedtime.    . rosuvastatin (CRESTOR) 20 MG tablet Take 20 mg by mouth daily.    Marland Kitchen telmisartan-hydrochlorothiazide (MICARDIS HCT) 80-25 MG tablet Take 1 tablet by mouth daily.    Marland Kitchen zolpidem (AMBIEN) 5 MG tablet Take 5 mg by mouth at bedtime as needed for sleep.     No current facility-administered medications on file prior to visit.    No Known Allergies  Blood pressure (!) 154/94, pulse (!) 58, height 5\' 11"  (1.803 m), weight 233 lb  (105.7 kg).  Essential hypertension Patient with poorly controlled hypertension (systolic and diastolic) despite being on 6 medications.  Denies compliance issue or herbal/supplemental medications.  Previous renal dopplers showed no indication of RAS.  Cannot test for hyperaldosteronism while on spironolactone and telmisartan, however there was no decrease in pressure with the addition of 25 mg spironolactone, so would not expect this to be a problem.  Last TSH was 2019, WNL and no health issues that would make me suspect thyroid issues.  Denies sleep issues (other than insomina - uses ambien) or snoring.    Today will increase the spironolactone to 50 mg daily and have him move the amlodipine to evenings for a better balance in his medications.  He has been seen in [facility] 3 times now, so will arrange for him to follow up with either Dr. Oval Linsey or Coletta Memos NP.  Tommy Medal PharmD CPP [facility] [address] [place] [address] 02/16/2020 10:34 AM

## 2020-02-16 NOTE — Assessment & Plan Note (Signed)
Patient with poorly controlled hypertension (systolic and diastolic) despite being on 6 medications.  Denies compliance issue or herbal/supplemental medications.  Previous renal dopplers showed no indication of RAS.  Cannot test for hyperaldosteronism while on spironolactone and telmisartan, however there was no decrease in pressure with the addition of 25 mg spironolactone, so would not expect this to be a problem.  Last TSH was 2019, WNL and no health issues that would make me suspect thyroid issues.  Denies sleep issues (other than insomina - uses ambien) or snoring.    Today will increase the spironolactone to 50 mg daily and have him move the amlodipine to evenings for a better balance in his medications.  He has been seen in [facility] 3 times now, so will arrange for him to follow up with either Dr. Oval Linsey or Coletta Memos NP.

## 2020-02-16 NOTE — Telephone Encounter (Signed)
Call placed to [phone]--listed as mobile number--redirected to mobile number [phone] Patient is interested in PREP. Works during day. Explained program and upcoming class scheduled 03/08/20 at the [facility]. Will be every T/TH from 615p-730 x 12 wks. Agreeable to schedule. Will call back closer to start of class to confirm participation and do intake to set goals. Given number for call back in the event he cannot do 7/20.

## 2020-03-03 ENCOUNTER — Telehealth: Payer: Self-pay | Admitting: *Deleted

## 2020-03-03 NOTE — Telephone Encounter (Signed)
Scheduled follow up and Left message to call back and ask for me or Wilburn Cornelia

## 2020-03-03 NOTE — Telephone Encounter (Signed)
-----   Message from Rockne Menghini, [place] sent at 02/16/2020  3:14 PM EDT ----- Mr. Richison has been seen x 3 with PharmD.  Your turn!  I told him you (or someone) would call to schedule him with either Oval Linsey of [employer]

## 2020-03-04 NOTE — Progress Notes (Signed)
[facility] Report   Patient Details  Name: Casey Hill MRN: [id number] Date of Birth: 1956/05/24 Age: 64 y.o. PCP: Josetta Huddle, MD  Vitals:   03/04/20 1206  BP: (!) 152/94  Pulse: 64  SpO2: 99%  Weight: 235 lb 9.6 oz (106.9 kg)  Height: 5\' 11"  (1.803 m)      [facility] Eval - 03/04/20 1200      Referral    Referring Provider HTN Clinic    Reason for referral Hypertension;Inactivity    Program Start Date 03/08/20   Tues/Thur 615p-730p [place]      Measurement   Neck measurement 17.5 Inches    Waist Circumference 46.5 inches    Body fat 31.3 percent      Information for Trainer   Goals Reduce back pain, exercise more     Current Exercise Walks weekends 2 miles     Orthopedic Concerns Plantar fasciitis     Pertinent Medical History HTN     Current Barriers none    Medications that affect exercise Beta blocker;Medication causing dizziness/drowsiness      Timed Up and Go (TUGS)   Timed Up and Go Low risk <9 seconds      Mobility and Daily Activities   I find it easy to walk up or down two or more flights of stairs. 2    I have no trouble taking out the trash. 4    I do housework such as vacuuming and dusting on my own without difficulty. 4    I can easily lift a gallon of milk (8lbs). 4    I can easily walk a mile. 4    I have no trouble reaching into high cupboards or reaching down to pick up something from the floor. 3    I do not have trouble doing out-door work such as Armed forces logistics/support/administrative officer, raking leaves, or gardening. 3      Mobility and Daily Activities   I feel younger than my age. 3    I feel independent. 4    I feel energetic. 3    I live an active life.  3    I feel strong. 2    I feel healthy. 3    I feel active as other people my age. 2      How fit and strong are you.   Fit and Strong Total Score 44          Past Medical History:  Diagnosis Date  . ADD (attention deficit disorder) 11/13/2019  . Essential hypertension 11/13/2019    . Pure hypercholesterolemia 11/13/2019   No past surgical history on file. Social History   Tobacco Use  Smoking Status Never Smoker  Smokeless Tobacco Never Used    Barnett Hatter 03/04/2020, 12:10 PM

## 2020-03-11 DIAGNOSIS — I10 Essential (primary) hypertension: Secondary | ICD-10-CM | POA: Diagnosis not present

## 2020-03-16 NOTE — Progress Notes (Signed)
[facility] PREP Weekly Session   Patient Details  Name: Casey Hill MRN: [id number] Date of Birth: 09/03/1955 Age: 64 y.o. PCP: Josetta Huddle, MD  There were no vitals filed for this visit.   [facility] Weekly seesion - 03/16/20 1000      Weekly Session   Topic Discussed Other ways to be active    Minutes exercised this week 65 minutes    Classes attended to date 3          Fun things since last meeting: went to dinner at friends Grateful for: family Nutrition celebration: ate fish 4 nights last week Barriers/struggles: chips and ice cream, foot pain Pam Tally Joe 03/16/2020, 10:48 AM

## 2020-03-16 NOTE — Telephone Encounter (Signed)
Left detailed message, ok per DPR  

## 2020-03-22 NOTE — Progress Notes (Signed)
Hypertension Clinic Follow Up:    Date:  05/05/2020   ID:  AMAAR Hill, DOB April 04, 1956, MRN [id number]  PCP:  Josetta Huddle, MD  Cardiologist:  No primary care provider on file.  Nephrologist:  Referring MD: Josetta Huddle, MD   CC: Hypertension  History of Present Illness:    Casey Hill is a 64 y.o. male with a hx of hypertension and hyperlipidemia here for follow-up.  Casey Hill was first diagnosed with hypertension approximately 15 years ago.  His blood pressure was previously well-controlled.  However in 2423 Bystolic was discontinued.  He was started on metoprolol and his blood pressure was not well have been controlled.  Therefore his PCP switched it to bisoprolol and it still remains elevated.  When he checks it it is typically around 536R to 443X systolic.  He walks regularly for 30 minutes most days and has no exertional chest pain or shortness of breath.  He denies lower extremity edema, orthopnea, or PND.  He follows a healthy diet and tries to limit his sodium intake.  He has been limiting carbohydrate intake and eating more salads for lunch.  He limits red meat or pork to once per week.  He mostly cooks at home and does not eat out much.  He has never smoked and has approximately 1 glass of wine nightly.  He takes his morning medications at around 630 or 7 and his evening medications before going to bed.  He was diagnosed with ADD as an adult.  He has been on multiple different stimulant agents.  Most recently he has been taking dextroamphetamine.  He does not take it on the weekends.  Dr. [name] reduced the dose to 5 mg daily from 10 mg he notes a difference in his ability to get through his day successfully since reducing the dose.  There has not been a significant change in his blood pressure on 5 mg versus 10 mg.  He does note that his blood pressure is better controlled on the weekends when he does not take it at all.  He does snore and thinks he may have some apneic  episodes.  He mostly feels rested in the mornings when he wakes up.  At his first appointment clonidine was switched to a patch.  Since his last appointment Mr. Stcyr has been seen by our [facility] HT[facility] pharmacists three times.  He was started on nebivolol and amlodipine was increased.  Bystolic was reduced to 10mg  due to bradycardia.  He notes that the Bystolic did not seem to really affect his blood pressure very much.  Spironolactone was added and then increased to 50mg .  Clonidine was also switched from tablets to a patch.  He was not tested for hyperaldosteronism.  However given that there is no change in his blood pressure after adding spironolactone, this was thought to be unlikely to be the on the underlying cause.  Spironolactone was increased to 50 mg on 6/29.  He has been enrolled in the remote patient monitoring system.  Overall he is seen an improvement in his blood pressures.  He just got back from the beach today.  Today was his first day back at work and he is feeling more stressed.  There was also an announcement that they have to go back to wearing mask because for people in the office had COVID-19.  He is feeling frustrated.  He has been increasing his exercise and working with the prep program at the [facility].  He really enjoys the class.  He also continues to try and limit the sodium in his diet.  Chart reviewed in [place].  [name]'s BP has been ranging from the 130s-150s/80-90s   Previous antihypertensives: Metoprolol Nebivolol  Past Medical History:  Diagnosis Date  . ADD (attention deficit disorder) 11/13/2019  . Essential hypertension 11/13/2019  . Pure hypercholesterolemia 11/13/2019    History reviewed. No pertinent surgical history.  Current Medications: Current Meds  Medication Sig  . amLODipine (NORVASC) 10 MG tablet Take 1 tablet (10 mg total) by mouth daily.  . cholecalciferol (VITAMIN D3) 25 MCG (1000 UNIT) tablet Take 1,000 Units by mouth daily.  . cloNIDine (CATAPRES -  DOSED IN MG/24 HR) 0.2 mg/24hr patch Place 1 patch (0.2 mg total) onto the skin once a week.  Marland Kitchen dextroamphetamine (DEXTROSTAT) 5 MG tablet Take 5 mg by mouth daily. pt taking 10 mg twice a day during week days.  . Omega-3 Fatty Acids (FISH OIL) 1000 MG CAPS Take 1,000 mg by mouth in the morning and at bedtime.  . rosuvastatin (CRESTOR) 20 MG tablet Take 20 mg by mouth daily.  Marland Kitchen spironolactone (ALDACTONE) 25 MG tablet Take 2 tablets (50 mg total) by mouth daily.  Marland Kitchen telmisartan-hydrochlorothiazide (MICARDIS HCT) 80-25 MG tablet Take 1 tablet by mouth daily.  Marland Kitchen zolpidem (AMBIEN) 5 MG tablet Take 5 mg by mouth at bedtime as needed for sleep.  . [DISCONTINUED] Nebivolol HCl 20 MG TABS Take 0.5 tablets (10 mg total) by mouth daily.     Allergies:   Patient has no known allergies.   Social History   Socioeconomic History  . Marital status: Married    Spouse name: Not on file  . Number of children: Not on file  . Years of education: Not on file  . Highest education level: Not on file  Occupational History  . Not on file  Tobacco Use  . Smoking status: Never Smoker  . Smokeless tobacco: Never Used  Substance and Sexual Activity  . Alcohol use: Yes    Alcohol/week: 7.0 standard drinks    Types: 7 Glasses of wine per week  . Drug use: Never  . Sexual activity: Not on file  Other Topics Concern  . Not on file  Social History Narrative  . Not on file   Social Determinants of Health   Financial Resource Strain:   . Difficulty of Paying Living Expenses: Not on file  Food Insecurity:   . Worried About Charity fundraiser in the Last Year: Not on file  . Ran Out of Food in the Last Year: Not on file  Transportation Needs:   . Lack of Transportation (Medical): Not on file  . Lack of Transportation (Non-Medical): Not on file  Physical Activity:   . Days of Exercise per Week: Not on file  . Minutes of Exercise per Session: Not on file  Stress:   . Feeling of Stress : Not on file    Social Connections:   . Frequency of Communication with Friends and Family: Not on file  . Frequency of Social Gatherings with Friends and Family: Not on file  . Attends Religious Services: Not on file  . Active Member of Clubs or Organizations: Not on file  . Attends Archivist Meetings: Not on file  . Marital Status: Not on file     Family History: The patient's family history includes Glaucoma in his father and mother; Healthy in his brother; Heart attack in his  father; Hyperlipidemia in his father; Hypertension in his father; Prostate cancer in his father; Stroke in his mother.  ROS:   Please see the history of present illness.    All other systems reviewed and are negative.  EKGs/Labs/Other Studies Reviewed:    EKG:  EKG is not ordered today.    Recent Labs: No results found for requested labs within last 8760 hours.   Recent Lipid Panel No results found for: CHOL, TRIG, HDL, CHOLHDL, VLDL, LDLCALC, LDLDIRECT  Physical Exam:    VS:  BP (!) 174/92   Pulse 67   Ht 5\' 11"  (1.803 m)   Wt 238 lb (108 kg)   BMI 33.19 kg/m  , BMI Body mass index is 33.19 kg/m. GENERAL:  Well appearing HEENT: Pupils equal round and reactive, fundi not visualized, oral mucosa unremarkable NECK:  No jugular venous distention, waveform within normal limits, carotid upstroke brisk and symmetric, no bruits, no thyromegaly LYMPHATICS:  No cervical adenopathy LUNGS:  Clear to auscultation bilaterally HEART:  RRR.  PMI not displaced or sustained,S1 and S2 within normal limits, no S3, no S4, no clicks, no rubs, no murmurs ABD:  Flat, positive bowel sounds normal in frequency in pitch, no bruits, no rebound, no guarding, no midline pulsatile mass, no hepatomegaly, no splenomegaly EXT:  2 plus pulses throughout, no edema, no cyanosis no clubbing SKIN:  No rashes no nodules NEURO:  Cranial nerves II through XII grossly intact, motor grossly intact throughout PSYCH:  Cognitively intact,  oriented to person place and time   ASSESSMENT:    1. Essential hypertension   2. Attention deficit hyperactivity disorder (ADHD), unspecified ADHD type     PLAN:    # Essential hypertension: Mr. Sheffer's BP remains elevated.  Overall it has been much better controlled.  Today it was high because he has been under a lot of stress.  In general his blood pressure is averaging in the 130s to 140s in the remote patient monitoring system.  He did not get much improvement in his blood pressure after restarting nebivolol.  It is also been quite costly.  We will stop this and switch to doxazosin 4 mg nightly.  Continue amlodipine, clonidine, spironolactone, telmisartan, and HCTZ.  Continue the PREP program and working on his diet.    Renal artery Dopplers were negative 11/2019.  # ADD: Continue dextroamphetamine.  There was no change in his blood pressure when it was reduced or stopped.  # Hyperlipidemia: Continue rosuvastatin.   Disposition:    FU with Gia Lusher C. Oval Linsey, MD, [facility] virtually  in 1 month    Medication Adjustments/Labs and Tests Ordered: Current medicines are reviewed at length with the patient today.  Concerns regarding medicines are outlined above.  No orders of the defined types were placed in this encounter.  Meds ordered this encounter  Medications  . doxazosin (CARDURA) 4 MG tablet    Sig: Take 1 tablet (4 mg total) by mouth at bedtime.    Dispense:  90 tablet    Refill:  1     Signed, Skeet Latch, MD  05/05/2020 1:52 PM    [facility]

## 2020-03-25 ENCOUNTER — Telehealth: Payer: Self-pay

## 2020-03-25 DIAGNOSIS — Z0000 Encounter for general adult medical examination without abnormal findings: Secondary | ICD-10-CM

## 2020-03-25 NOTE — Telephone Encounter (Signed)
Called patient to follow up regarding BP checks with [employer]. Left message for patient to return call to Care Guide at [phone].

## 2020-03-28 ENCOUNTER — Ambulatory Visit (INDEPENDENT_AMBULATORY_CARE_PROVIDER_SITE_OTHER): Payer: BC Managed Care – PPO | Admitting: Cardiovascular Disease

## 2020-03-28 ENCOUNTER — Other Ambulatory Visit: Payer: Self-pay

## 2020-03-28 ENCOUNTER — Encounter: Payer: Self-pay | Admitting: Cardiovascular Disease

## 2020-03-28 VITALS — BP 174/92 | HR 67 | Ht 71.0 in | Wt 238.0 lb

## 2020-03-28 DIAGNOSIS — F909 Attention-deficit hyperactivity disorder, unspecified type: Secondary | ICD-10-CM

## 2020-03-28 DIAGNOSIS — I10 Essential (primary) hypertension: Secondary | ICD-10-CM | POA: Diagnosis not present

## 2020-03-28 MED ORDER — DOXAZOSIN MESYLATE 4 MG PO TABS
4.0000 mg | ORAL_TABLET | Freq: Every day | ORAL | 1 refills | Status: DC
Start: 2020-03-28 — End: 2020-03-28

## 2020-03-28 NOTE — Patient Instructions (Addendum)
Medication Instructions:  STOP BYSTOLIC   START DOXAZOSIN 4 MG AT BEDTIME   Labwork: NONE   Testing/Procedures: NONE   Follow-Up: 05/05/2020 AT 1:30 WITH DR Valmeyer -VIRTUAL VISIT OK

## 2020-03-30 NOTE — Progress Notes (Signed)
[facility] [employer] PREP Weekly Session   Patient Details  Name: Casey Hill MRN: [id number] Date of Birth: 03-13-1956 Age: 64 y.o. PCP: Josetta Huddle, MD  Vitals:   03/29/20 1815  Weight: 237 lb (107.5 kg)     [facility] [employer] Weekly seesion - 03/30/20 1400      Weekly Session   Topic Discussed Health habits    Minutes exercised this week 130 minutes    Classes attended to date 5         Class on 03/29/20  Fun things since last meeting: vacation at [employer] for: family Nutrition celebration: only 1 day of red meat Barriers/struggles: vacation, junk food   Barnett Hatter 03/30/2020, 2:08 PM

## 2020-04-06 NOTE — Progress Notes (Signed)
[facility] PREP Weekly Session   Patient Details  Name: Casey Hill MRN: [id number] Date of Birth: 07-04-1956 Age: 64 y.o. PCP: Josetta Huddle, MD  Vitals:   04/05/20 1815  Weight: 238 lb (108 kg)     [facility] [employer] Weekly seesion - 04/06/20 1000      Weekly Session   Topic Discussed Restaurant Eating;Water    Minutes exercised this week 170 minutes    Classes attended to date 7          Fun things since last meeting: spent weekend at [employer] for: spouse and children Nutrition celebration: beer only 1 day Barriers/struggles: putting in enough true cardio   Barnett Hatter 04/06/2020, 10:51 AM

## 2020-04-10 DIAGNOSIS — I10 Essential (primary) hypertension: Secondary | ICD-10-CM | POA: Diagnosis not present

## 2020-04-13 NOTE — Progress Notes (Signed)
[facility] PREP Weekly Session   Patient Details  Name: Casey Hill MRN: [id number] Date of Birth: 10/24/1955 Age: 64 y.o. PCP: Josetta Huddle, MD  Vitals:   04/12/20 1815  Weight: 238 lb (108 kg)     [facility] [employer] Weekly seesion - 04/13/20 0900      Weekly Session   Topic Discussed Stress management and problem solving    Minutes exercised this week 185 minutes    Classes attended to date 36          Grateful for: weekend at [place] with friends Nutrition celebration: beef 1 day Barriers/struggles: weekend food and maintaining portion sizes   Barnett Hatter 04/13/2020, 9:20 AM

## 2020-05-04 NOTE — Progress Notes (Signed)
[facility] PREP Weekly Session   Patient Details  Name: Casey Hill MRN: [id number] Date of Birth: 30-Aug-1955 Age: 64 y.o. PCP: Josetta Huddle, MD  Vitals:   05/03/20 1815  Weight: 238 lb (108 kg)     [facility] [employer] Weekly seesion - 05/04/20 1100      Weekly Session   Topic Discussed Finding support    Minutes exercised this week 230 minutes    Classes attended to date 85          Fun things since last meeting: spent week at [place] for my spouse Nutrition celebration: a lot of fish and salads Barriers/struggles: tempting sweets   Barnett Hatter 05/04/2020, 11:19 AM

## 2020-05-05 ENCOUNTER — Encounter: Payer: Self-pay | Admitting: Cardiovascular Disease

## 2020-05-05 ENCOUNTER — Telehealth (INDEPENDENT_AMBULATORY_CARE_PROVIDER_SITE_OTHER): Payer: BC Managed Care – PPO | Admitting: Cardiovascular Disease

## 2020-05-05 VITALS — BP 140/83 | Ht 71.0 in | Wt 237.0 lb

## 2020-05-05 DIAGNOSIS — E7800 Pure hypercholesterolemia, unspecified: Secondary | ICD-10-CM | POA: Diagnosis not present

## 2020-05-05 DIAGNOSIS — I10 Essential (primary) hypertension: Secondary | ICD-10-CM

## 2020-05-05 MED ORDER — DOXAZOSIN MESYLATE 4 MG PO TABS: 4.0000 mg | ORAL_TABLET | Freq: Every day | ORAL | 1 refills | Status: DC

## 2020-05-05 MED ORDER — DOXAZOSIN MESYLATE 8 MG PO TABS: 8.0000 mg | ORAL_TABLET | Freq: Every day | ORAL | 3 refills | Status: DC

## 2020-05-05 NOTE — Patient Instructions (Addendum)
Medication Instructions:  INCREASE YOUR DOXAZOSIN TO 8 MG AT BEDTIME   Labwork: NONE  Testing/Procedures: NONE  Follow-Up: VIRTUAL VISIT 1 MONTH 06/06/2020 AT 1:45 PM

## 2020-05-05 NOTE — Progress Notes (Signed)
Hypertension Clinic Virtual Visit via Video Note   This visit type was conducted due to national recommendations for restrictions regarding the COVID-19 Pandemic (e.g. social distancing) in an effort to limit this patient's exposure and mitigate transmission in our community.  Due to his co-morbid illnesses, this patient is at least at moderate risk for complications without adequate follow up.  This format is felt to be most appropriate for this patient at this time.  All issues noted in this document were discussed and addressed.  A limited physical exam was performed with this format.  Please refer to the patient's chart for his consent to telehealth for [facility].     The patient was identified using 2 identifiers.  Date:  05/05/2020   ID:  Casey Hill, DOB [birth year], MRN [id number]  Patient Location: Other:  work Provider Location: Office/Clinic  PCP:  Josetta Huddle, MD  Cardiologist:  No primary care provider on file.  Electrophysiologist:  None   Evaluation Performed:  Follow-Up Visit  Chief Complaint:  hypertension   History of Present Illness:    Casey Hill is a 64 y.o. male with a hx of hypertension and hyperlipidemia here for follow-up.  Casey Hill was first diagnosed with hypertension approximately 15 years ago.  His blood pressure was previously well-controlled.  However in 6979 Bystolic was discontinued.  He was started on metoprolol and his blood pressure was not well have been controlled.  Therefore his PCP switched it to bisoprolol and it still remains elevated.  When he checks it it is typically around 480X to 655V systolic.  He walks regularly for 30 minutes most days and has no exertional chest pain or shortness of breath.  He denies lower extremity edema, orthopnea, or PND.  He follows a healthy diet and tries to limit his sodium intake.  He has been limiting carbohydrate intake and eating more salads for lunch.  He limits red meat or pork to once per  week.  He mostly cooks at home and does not eat out much.  He has never smoked and has approximately 1 glass of wine nightly.  He takes his morning medications at around 630 or 7 and his evening medications before going to bed.  He was diagnosed with ADD as an adult.  He has been on multiple different stimulant agents.  Most recently he has been taking dextroamphetamine.  He does not take it on the weekends.  Dr. [name] reduced the dose to 5 mg daily from 10 mg he notes a difference in his ability to get through his day successfully since reducing the dose.  There has not been a significant change in his blood pressure on 5 mg versus 10 mg.  He does note that his blood pressure is better controlled on the weekends when he does not take it at all.  He does snore and thinks he may have some apneic episodes.  He mostly feels rested in the mornings when he wakes up.  At his first appointment clonidine was switched to a patch.  Since his last appointment Mr. Bossman has been seen by our [facility] HTN pharmacists three times.  He was started on nebivolol and amlodipine was increased.  Bystolic was reduced to 10mg  due to bradycardia.  He notes that the Bystolic did not seem to really affect his blood pressure very much.  Spironolactone was added and then increased to 50mg .  Clonidine was also switched from tablets to a patch.  He was not  tested for hyperaldosteronism.  However given that there is no change in his blood pressure after adding spironolactone, this was thought to be unlikely to be the on the underlying cause.  Spironolactone was increased to 50 mg on 6/29.  He has been enrolled in the remote patient monitoring system.  Overall he is seen an improvement in his blood pressures.  At his last appointment Casey Hill's blood pressure was still elevated and he did not get much improvement on nebivolol.  Given the cost this was discontinued and he was started on doxazosin. Chart reviewed in [place].  Mr. Pankratz's BP is  averaging 136/85.  He is tolerating doxazosin well.  He has been feeling well and exercising regularly. He walks five days per week and goes to the PREP program at the [facility] twice per week.  He is trying to lose weight.  He has no LE edema, orthopnea or PND.  Previous antihypertensives: Metoprolol Nebivolol  Past Medical History:  Diagnosis Date  . ADD (attention deficit disorder) 11/13/2019  . Essential hypertension 11/13/2019  . Pure hypercholesterolemia 11/13/2019    No past surgical history on file.  Current Medications: Current Meds  Medication Sig  . amLODipine (NORVASC) 10 MG tablet Take 1 tablet (10 mg total) by mouth daily.  . cholecalciferol (VITAMIN D3) 25 MCG (1000 UNIT) tablet Take 1,000 Units by mouth daily.  . cloNIDine (CATAPRES - DOSED IN MG/24 HR) 0.2 mg/24hr patch Place 1 patch (0.2 mg total) onto the skin once a week.  Marland Kitchen dextroamphetamine (DEXTROSTAT) 5 MG tablet Take 5 mg by mouth daily. pt taking 10 mg twice a day during week days.  . Omega-3 Fatty Acids (FISH OIL) 1000 MG CAPS Take 1,000 mg by mouth in the morning and at bedtime.  . rosuvastatin (CRESTOR) 20 MG tablet Take 20 mg by mouth daily.  Marland Kitchen spironolactone (ALDACTONE) 25 MG tablet Take 2 tablets (50 mg total) by mouth daily.  Marland Kitchen telmisartan-hydrochlorothiazide (MICARDIS HCT) 80-25 MG tablet Take 1 tablet by mouth daily.  Marland Kitchen zolpidem (AMBIEN) 5 MG tablet Take 5 mg by mouth at bedtime as needed for sleep.  . [DISCONTINUED] doxazosin (CARDURA) 4 MG tablet Take 1 tablet (4 mg total) by mouth at bedtime.     Allergies:   Patient has no known allergies.   Social History   Socioeconomic History  . Marital status: Married    Spouse name: Not on file  . Number of children: Not on file  . Years of education: Not on file  . Highest education level: Not on file  Occupational History  . Not on file  Tobacco Use  . Smoking status: Never Smoker  . Smokeless tobacco: Never Used  Substance and Sexual Activity  .  Alcohol use: Yes    Alcohol/week: 7.0 standard drinks    Types: 7 Glasses of wine per week  . Drug use: Never  . Sexual activity: Not on file  Other Topics Concern  . Not on file  Social History Narrative  . Not on file   Social Determinants of Health   Financial Resource Strain:   . Difficulty of Paying Living Expenses: Not on file  Food Insecurity:   . Worried About Charity fundraiser in the Last Year: Not on file  . Ran Out of Food in the Last Year: Not on file  Transportation Needs:   . Lack of Transportation (Medical): Not on file  . Lack of Transportation (Non-Medical): Not on file  Physical Activity:   .  Days of Exercise per Week: Not on file  . Minutes of Exercise per Session: Not on file  Stress:   . Feeling of Stress : Not on file  Social Connections:   . Frequency of Communication with Friends and Family: Not on file  . Frequency of Social Gatherings with Friends and Family: Not on file  . Attends Religious Services: Not on file  . Active Member of Clubs or Organizations: Not on file  . Attends Archivist Meetings: Not on file  . Marital Status: Not on file     Family History: The patient's family history includes Glaucoma in his father and mother; Healthy in his brother; Heart attack in his father; Hyperlipidemia in his father; Hypertension in his father; Prostate cancer in his father; Stroke in his mother.  ROS:   Please see the history of present illness.    All other systems reviewed and are negative.  EKGs/Labs/Other Studies Reviewed:    EKG:  EKG is not ordered today.    Recent Labs: No results found for requested labs within last 8760 hours.   Recent Lipid Panel No results found for: CHOL, TRIG, HDL, CHOLHDL, VLDL, LDLCALC, LDLDIRECT  Physical Exam:    BP 140/83   Ht 5\' 11"  (1.803 m)   Wt 237 lb (107.5 kg)   BMI 33.05 kg/m  GENERAL: Well-appearing.  No acute distress. HEENT: Pupils equal round.  Oral mucosa unremarkable NECK:   No jugular venous distention, no visible thyromegaly EXT:  No edema, no cyanosis no clubbing SKIN:  No rashes no nodules NEURO:  Speech fluent.  Cranial nerves grossly intact.  Moves all 4 extremities freely PSYCH:  Cognitively intact, oriented to person place and time  ASSESSMENT:    1. Essential hypertension   2. Pure hypercholesterolemia     PLAN:    # Essential hypertension: Mr. Hillegass's BP remains elevated.  Overall it has been much better controlled.  Today it was high because he has been under a lot of stress.  In general his blood pressure is averaging in the 130s to 140s in the remote patient monitoring system.  He did not get much improvement in his blood pressure after restarting nebivolol.  It is also been quite costly.  We will stop this and switch to doxazosin 4 mg nightly.  Continue amlodipine, clonidine, spironolactone, telmisartan, and HCTZ.  Continue the PrEP program and working on his diet.    Renal artery Dopplers were negative 11/2019.  # ADD: Continue dextroamphetamine.  There was no change in his blood pressure when it was reduced or stopped.  # Hyperlipidemia: Continue rosuvastatin.   Disposition:    FU with Galaxy Borden C. Oval Linsey, MD, [facility] virtually  in 1 month    Medication Adjustments/Labs and Tests Ordered: Current medicines are reviewed at length with the patient today.  Concerns regarding medicines are outlined above.  No orders of the defined types were placed in this encounter.  No orders of the defined types were placed in this encounter.   COVID-19 Education: The signs and symptoms of COVID-19 were discussed with the patient and how to seek care for testing (follow up with PCP or arrange E-visit).  The importance of social distancing was discussed today.  Time:   Today, I have spent 15 minutes with the patient with telehealth technology discussing the above problems.     Signed, Skeet Latch, MD  05/05/2020 2:59 PM    [place]

## 2020-05-10 DIAGNOSIS — I10 Essential (primary) hypertension: Secondary | ICD-10-CM | POA: Diagnosis not present

## 2020-05-10 NOTE — Addendum Note (Signed)
Addended by: Mike Craze on: 05/10/2020 03:11 PM   Modules accepted: Orders

## 2020-05-11 NOTE — Progress Notes (Signed)
[facility] [employer] PREP Weekly Session   Patient Details  Name: Casey Hill MRN: [id number] Date of Birth: [birth year] Age: 64 y.o. 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

## 2020-05-25 NOTE — Progress Notes (Signed)
[facility] PREP Weekly Session   Patient Details  Name: Casey Hill MRN: [id number] Date of Birth: 20-Jun-1956 Age: 64 y.o. PCP: Josetta Huddle, MD  Vitals:   05/24/20 1815  Weight: 239 lb (108.4 kg)     [facility] [employer] Weekly seesion - 05/25/20 1000      Weekly Session   Topic Discussed Hitting roadblocks    Minutes exercised this week 190 minutes    Classes attended to date 37          PREP class 05/24/20: Fun things since last meeting: went to see grandkids play ball Grateful for: family Nutrition celebration: salads for lunches Barriers/struggles: desserts   Pam Tally Joe 05/25/2020, 10:37 AM

## 2020-06-01 NOTE — Progress Notes (Signed)
[facility] PREP Weekly Session   Patient Details  Name: Casey Hill MRN: [id number] Date of Birth: 02-17-1956 Age: 64 y.o. PCP: Josetta Huddle, MD  Vitals:   06/01/20 1532  Weight: 239 lb (108.4 kg)     [facility] Weekly seesion - 06/01/20 1500      Weekly Session   Topic Discussed --   final fit testing    Minutes exercised this week 175 minutes    Classes attended to date 19         Class on 05/31/20 at 615pm, late entry [place] things since last meeting: went to see wicked Grateful for: family Nutrition celebration: 2 new fish recipes Barriers/Struggles: family cooking in [place] 06/01/2020, 3:32 PM

## 2020-06-06 ENCOUNTER — Telehealth (INDEPENDENT_AMBULATORY_CARE_PROVIDER_SITE_OTHER): Payer: BC Managed Care – PPO | Admitting: Cardiovascular Disease

## 2020-06-06 ENCOUNTER — Encounter: Payer: Self-pay | Admitting: Cardiovascular Disease

## 2020-06-06 VITALS — BP 137/84 | HR 68 | Ht 71.0 in | Wt 239.0 lb

## 2020-06-06 DIAGNOSIS — I10 Essential (primary) hypertension: Secondary | ICD-10-CM

## 2020-06-06 DIAGNOSIS — E7800 Pure hypercholesterolemia, unspecified: Secondary | ICD-10-CM

## 2020-06-06 NOTE — Patient Instructions (Signed)
Medication Instructions:  Your physician recommends that you continue on your current medications as directed. Please refer to the Current Medication list given to you today.   Labwork: NONE    Testing/Procedures: NONE    Follow-Up: Your physician wants you to follow-up in: [place]  You will receive a reminder letter in the mail two months in advance. If you don't receive a letter, please call our office to schedule the follow-up appointment.

## 2020-06-06 NOTE — Progress Notes (Signed)
Hypertension Clinic Virtual Visit via Video Note   This visit type was conducted due to national recommendations for restrictions regarding the COVID-19 Pandemic (e.g. social distancing) in an effort to limit this patient's exposure and mitigate transmission in our community.  Due to his co-morbid illnesses, this patient is at least at moderate risk for complications without adequate follow up.  This format is felt to be most appropriate for this patient at this time.  All issues noted in this document were discussed and addressed.  A limited physical exam was performed with this format.  Please refer to the patient's chart for his consent to telehealth for [facility].     The patient was identified using 2 identifiers.  Date:  06/06/2020   ID:  Casey Hill, DOB 08-08-1956, MRN [id number]  Patient Location: Other:  work Provider Location: Office/Clinic  PCP:  Josetta Huddle, MD  Cardiologist:  No primary care provider on file.  Electrophysiologist:  None   Evaluation Performed:  Follow-Up Visit  Chief Complaint:  hypertension   History of Present Illness:    Casey Hill is a 64 y.o. male with a hx of hypertension and hyperlipidemia here for follow-up.  Casey Hill was first diagnosed with hypertension approximately 15 years ago.  His blood pressure was previously well-controlled.  However in 3716 Bystolic was discontinued.  He was started on metoprolol and his blood pressure was not well have been controlled.  Therefore his PCP switched it to bisoprolol and it still remains elevated.  When he checks it it is typically around 967E to 938B systolic.  He walks regularly for 30 minutes most days and has no exertional chest pain or shortness of breath.  He denies lower extremity edema, orthopnea, or PND.  He follows a healthy diet and tries to limit his sodium intake.  He has been limiting carbohydrate intake and eating more salads for lunch.  He limits red meat or pork to once per  week.  He mostly cooks at home and does not eat out much.  He has never smoked and has approximately 1 glass of wine nightly.  He takes his morning medications at around 630 or 7 and his evening medications before going to bed.  He was diagnosed with ADD as an adult.  He has been on multiple different stimulant agents.  Most recently he has been taking dextroamphetamine.  He does not take it on the weekends.  Dr. [name] reduced the dose to 5 mg daily from 10 mg he notes a difference in his ability to get through his day successfully since reducing the dose.  There wasn't a significant change in his blood pressure on 5 mg versus 10 mg.  He does note that his blood pressure is better controlled on the weekends when he does not take it at all.    At his first appointment clonidine was switched to a patch.  Since his last appointment Casey Hill has been seen by our [facility] HT[facility] pharmacists three times.  He was started on nebivolol and amlodipine was increased.  Bystolic was reduced to 10mg  due to bradycardia.  He notes that the Bystolic did not seem to really affect his blood pressure very much.  Spironolactone was added and then increased to 50mg .  Clonidine was also switched from tablets to a patch.  He was not tested for hyperaldosteronism.  However given that there is no change in his blood pressure after adding spironolactone, this was thought to be unlikely  to be the on the underlying cause.  Spironolactone was increased to 50 mg on 6/29.  He has been enrolled in the remote patient monitoring system.  Overall he is seen an improvement in his blood pressures.  At his last appointment his blood pressure remained poorly controlled and nebivolol was costly.  This was discontinued and he was started on doxazosin.  His average blood pressure of on remote patient monitoring has been 129/81.  He completed the exercise program at the [facility] and continues to workout 3 days/week.  He has no exertional chest pain or shortness of  breath.  He has been feeling well.  He denies any lower extremity edema, orthopnea, or PND.  Previous antihypertensives: Metoprolol Nebivolol  Past Medical History:  Diagnosis Date   ADD (attention deficit disorder) 11/13/2019   Essential hypertension 11/13/2019   Pure hypercholesterolemia 11/13/2019    History reviewed. No pertinent surgical history.  Current Medications: Current Meds  Medication Sig   amLODipine (NORVASC) 10 MG tablet Take 1 tablet (10 mg total) by mouth daily.   cholecalciferol (VITAMIN D3) 25 MCG (1000 UNIT) tablet Take 1,000 Units by mouth daily.   cloNIDine (CATAPRES - DOSED IN MG/24 HR) 0.2 mg/24hr patch Place 1 patch (0.2 mg total) onto the skin once a week.   dextroamphetamine (DEXTROSTAT) 5 MG tablet Take 10 mg by mouth 2 (two) times daily.    doxazosin (CARDURA) 8 MG tablet Take 1 tablet (8 mg total) by mouth at bedtime.   Omega-3 Fatty Acids (FISH OIL) 1000 MG CAPS Take 1,000 mg by mouth in the morning and at bedtime.   rosuvastatin (CRESTOR) 20 MG tablet Take 20 mg by mouth daily.   telmisartan-hydrochlorothiazide (MICARDIS HCT) 80-25 MG tablet Take 1 tablet by mouth daily.   zolpidem (AMBIEN) 5 MG tablet Take 5 mg by mouth at bedtime as needed for sleep.     Allergies:   Patient has no known allergies.   Social History   Socioeconomic History   Marital status: Married    Spouse name: Not on file   Number of children: Not on file   Years of education: Not on file   Highest education level: Not on file  Occupational History   Not on file  Tobacco Use   Smoking status: Never Smoker   Smokeless tobacco: Never Used  Substance and Sexual Activity   Alcohol use: Yes    Alcohol/week: 7.0 standard drinks    Types: 7 Glasses of wine per week   Drug use: Never   Sexual activity: Not on file  Other Topics Concern   Not on file  Social History Narrative   Not on file   Social Determinants of Health   Financial Resource  Strain:    Difficulty of Paying Living Expenses: Not on file  Food Insecurity:    Worried About [place] in the Last Year: Not on file   Ran Out of Food in the Last Year: Not on file  Transportation Needs:    Lack of Transportation (Medical): Not on file   Lack of Transportation (Non-Medical): Not on file  Physical Activity:    Days of Exercise per Week: Not on file   Minutes of Exercise per Session: Not on file  Stress:    Feeling of Stress : Not on file  Social Connections:    Frequency of Communication with Friends and Family: Not on file   Frequency of Social Gatherings with Friends and Family: Not on file  Attends Religious Services: Not on file   Active Member of Clubs or Organizations: Not on file   Attends Archivist Meetings: Not on file   Marital Status: Not on file     Family History: The patient's family history includes Glaucoma in his father and mother; Healthy in his brother; Heart attack in his father; Hyperlipidemia in his father; Hypertension in his father; Prostate cancer in his father; Stroke in his mother.  ROS:   Please see the history of present illness.    All other systems reviewed and are negative.  EKGs/Labs/Other Studies Reviewed:    EKG:  EKG is not ordered today.    Recent Labs: No results found for requested labs within last 8760 hours.   Recent Lipid Panel No results found for: CHOL, TRIG, HDL, CHOLHDL, VLDL, LDLCALC, LDLDIRECT  Physical Exam:    BP 137/84    Pulse 68    Ht 5\' 11"  (1.803 m)    Wt 239 lb (108.4 kg)    BMI 33.33 kg/m  GENERAL: Well-appearing.  No acute distress. HEENT: Pupils equal round.  Oral mucosa unremarkable NECK:  No jugular venous distention, no visible thyromegaly EXT:  No edema, no cyanosis no clubbing SKIN:  No rashes no nodules NEURO:  Speech fluent.  Cranial nerves grossly intact.  Moves all 4 extremities freely PSYCH:  Cognitively intact, oriented to person place and  time  ASSESSMENT:    1. Essential hypertension   2. Pure hypercholesterolemia     PLAN:    # Essential hypertension: Casey Hill's blood pressures have been controlled.  He will continue his current regimen of amlodipine, clonidine, doxazosin, spironolactone and telmisartan/HCTZ.  Renal artery Dopplers were negative 11/2019.  # ADD: Continue dextroamphetamine.  There was no change in his blood pressure when it was reduced or stopped.  # Hyperlipidemia: Continue rosuvastatin.  Lipids are managed by his PCP.   Disposition:    FU with Ramisa Duman C. Oval Linsey, MD, [facility] virtually  in 6 months   Medication Adjustments/Labs and Tests Ordered: Current medicines are reviewed at length with the patient today.  Concerns regarding medicines are outlined above.  No orders of the defined types were placed in this encounter.  No orders of the defined types were placed in this encounter.   COVID-19 Education: The signs and symptoms of COVID-19 were discussed with the patient and how to seek care for testing (follow up with PCP or arrange E-visit).  The importance of social distancing was discussed today.  Time:   Today, I have spent 17 minutes with the patient with telehealth technology discussing the above problems.     Signed, Skeet Latch, MD  06/06/2020 2:26 PM    [place]

## 2020-06-06 NOTE — Progress Notes (Signed)
[place] Report   Patient Details  Name: Casey Hill MRN: [id number] Date of Birth: Oct 18, 1955 Age: 64 y.o. PCP: Josetta Huddle, MD  Vitals:   06/06/20 1643  BP: 132/84  Pulse: 80  SpO2: 98%  Weight: 237 lb (107.5 kg)      [facility] Eval - 06/06/20 1600      Referral    Reason for referral --   Final measures     Mobility and Daily Activities   I find it easy to walk up or down two or more flights of stairs. 3    I have no trouble taking out the trash. 4    I do housework such as vacuuming and dusting on my own without difficulty. 4    I can easily lift a gallon of milk (8lbs). 4    I can easily walk a mile. 4    I have no trouble reaching into high cupboards or reaching down to pick up something from the floor. 3    I do not have trouble doing out-door work such as Armed forces logistics/support/administrative officer, raking leaves, or gardening. 3      Mobility and Daily Activities   I feel younger than my age. 3    I feel independent. 4    I feel energetic. 3    I live an active life.  3    I feel strong. 3    I feel healthy. 3    I feel active as other people my age. 3      How fit and strong are you.   Fit and Strong Total Score 47          Past Medical History:  Diagnosis Date  . ADD (attention deficit disorder) 11/13/2019  . Essential hypertension 11/13/2019  . Pure hypercholesterolemia 11/13/2019   No past surgical history on file. Social History   Tobacco Use  Smoking Status Never Smoker  Smokeless Tobacco Never Used   Great gains in cardio endurance, improvement in BP. Also improved in strength.  Encouraged continuing to exercise and blocking time on calendar at work to do so.   Barnett Hatter 06/06/2020, 4:45 PM

## 2020-06-16 DIAGNOSIS — Z85828 Personal history of other malignant neoplasm of skin: Secondary | ICD-10-CM | POA: Diagnosis not present

## 2020-06-16 DIAGNOSIS — D225 Melanocytic nevi of trunk: Secondary | ICD-10-CM | POA: Diagnosis not present

## 2020-06-16 DIAGNOSIS — L821 Other seborrheic keratosis: Secondary | ICD-10-CM | POA: Diagnosis not present

## 2020-06-16 DIAGNOSIS — L570 Actinic keratosis: Secondary | ICD-10-CM | POA: Diagnosis not present

## 2020-06-16 DIAGNOSIS — D0421 Carcinoma in situ of skin of right ear and external auricular canal: Secondary | ICD-10-CM | POA: Diagnosis not present

## 2020-07-18 ENCOUNTER — Telehealth: Payer: Self-pay

## 2020-07-18 DIAGNOSIS — Z0000 Encounter for general adult medical examination without abnormal findings: Secondary | ICD-10-CM

## 2020-07-18 NOTE — Telephone Encounter (Signed)
Called the patient to inform him that he completed the 16-week [employer] program and that he does not have to continue tracking his BP in the app. Patient is aware that he is to continue keeping a paper log of his BP readings to bring to his follow up appointments with Dr. Oval Linsey. Patient received his BP cuff from the office.

## 2020-08-03 ENCOUNTER — Other Ambulatory Visit: Payer: Self-pay | Admitting: Cardiovascular Disease

## 2020-08-22 ENCOUNTER — Other Ambulatory Visit: Payer: Self-pay | Admitting: Cardiovascular Disease

## 2020-08-30 DIAGNOSIS — R972 Elevated prostate specific antigen [PSA]: Secondary | ICD-10-CM | POA: Diagnosis not present

## 2020-08-30 DIAGNOSIS — Z0000 Encounter for general adult medical examination without abnormal findings: Secondary | ICD-10-CM | POA: Diagnosis not present

## 2020-08-30 DIAGNOSIS — R7303 Prediabetes: Secondary | ICD-10-CM | POA: Diagnosis not present

## 2020-08-30 DIAGNOSIS — F909 Attention-deficit hyperactivity disorder, unspecified type: Secondary | ICD-10-CM | POA: Diagnosis not present

## 2020-09-13 MED ORDER — EPLERENONE 50 MG PO TABS: 50.0000 mg | ORAL_TABLET | Freq: Every day | ORAL | 1 refills | Status: DC

## 2020-10-11 DIAGNOSIS — H5213 Myopia, bilateral: Secondary | ICD-10-CM | POA: Diagnosis not present

## 2020-10-11 DIAGNOSIS — H25043 Posterior subcapsular polar age-related cataract, bilateral: Secondary | ICD-10-CM | POA: Diagnosis not present

## 2020-10-11 DIAGNOSIS — H35372 Puckering of macula, left eye: Secondary | ICD-10-CM | POA: Diagnosis not present

## 2020-10-11 DIAGNOSIS — H3500 Unspecified background retinopathy: Secondary | ICD-10-CM | POA: Diagnosis not present

## 2020-11-10 DIAGNOSIS — H25812 Combined forms of age-related cataract, left eye: Secondary | ICD-10-CM | POA: Diagnosis not present

## 2020-11-10 DIAGNOSIS — H2512 Age-related nuclear cataract, left eye: Secondary | ICD-10-CM | POA: Diagnosis not present

## 2020-12-05 DIAGNOSIS — N5201 Erectile dysfunction due to arterial insufficiency: Secondary | ICD-10-CM | POA: Diagnosis not present

## 2020-12-05 DIAGNOSIS — R972 Elevated prostate specific antigen [PSA]: Secondary | ICD-10-CM | POA: Diagnosis not present

## 2021-01-31 ENCOUNTER — Other Ambulatory Visit: Payer: Self-pay | Admitting: Cardiovascular Disease

## 2021-03-02 ENCOUNTER — Other Ambulatory Visit: Payer: Self-pay | Admitting: Cardiovascular Disease

## 2021-03-02 NOTE — Telephone Encounter (Signed)
Rx request sent to pharmacy.  

## 2021-03-06 DIAGNOSIS — R972 Elevated prostate specific antigen [PSA]: Secondary | ICD-10-CM | POA: Diagnosis not present

## 2021-03-09 ENCOUNTER — Other Ambulatory Visit: Payer: Self-pay | Admitting: Urology

## 2021-03-09 ENCOUNTER — Other Ambulatory Visit: Payer: Self-pay | Admitting: General Practice

## 2021-03-09 DIAGNOSIS — R972 Elevated prostate specific antigen [PSA]: Secondary | ICD-10-CM

## 2021-04-14 DIAGNOSIS — R972 Elevated prostate specific antigen [PSA]: Secondary | ICD-10-CM | POA: Diagnosis not present

## 2021-04-14 DIAGNOSIS — C61 Malignant neoplasm of prostate: Secondary | ICD-10-CM | POA: Diagnosis not present

## 2021-04-20 DIAGNOSIS — C61 Malignant neoplasm of prostate: Secondary | ICD-10-CM | POA: Diagnosis not present

## 2021-04-27 ENCOUNTER — Other Ambulatory Visit: Payer: Self-pay | Admitting: Cardiovascular Disease

## 2021-05-07 ENCOUNTER — Other Ambulatory Visit: Payer: Self-pay | Admitting: Cardiovascular Disease

## 2021-06-13 DIAGNOSIS — C44329 Squamous cell carcinoma of skin of other parts of face: Secondary | ICD-10-CM | POA: Diagnosis not present

## 2021-06-13 DIAGNOSIS — D485 Neoplasm of uncertain behavior of skin: Secondary | ICD-10-CM | POA: Diagnosis not present

## 2021-06-13 DIAGNOSIS — Z85828 Personal history of other malignant neoplasm of skin: Secondary | ICD-10-CM | POA: Diagnosis not present

## 2021-06-13 DIAGNOSIS — L814 Other melanin hyperpigmentation: Secondary | ICD-10-CM | POA: Diagnosis not present

## 2021-06-13 DIAGNOSIS — D225 Melanocytic nevi of trunk: Secondary | ICD-10-CM | POA: Diagnosis not present

## 2021-06-13 DIAGNOSIS — L821 Other seborrheic keratosis: Secondary | ICD-10-CM | POA: Diagnosis not present

## 2021-06-13 DIAGNOSIS — L570 Actinic keratosis: Secondary | ICD-10-CM | POA: Diagnosis not present

## 2021-06-16 ENCOUNTER — Ambulatory Visit (HOSPITAL_BASED_OUTPATIENT_CLINIC_OR_DEPARTMENT_OTHER): Payer: BC Managed Care – PPO | Admitting: Cardiovascular Disease

## 2021-06-19 ENCOUNTER — Encounter (HOSPITAL_BASED_OUTPATIENT_CLINIC_OR_DEPARTMENT_OTHER): Payer: Self-pay | Admitting: Cardiovascular Disease

## 2021-06-19 ENCOUNTER — Ambulatory Visit (HOSPITAL_BASED_OUTPATIENT_CLINIC_OR_DEPARTMENT_OTHER): Payer: BC Managed Care – PPO | Admitting: Cardiovascular Disease

## 2021-06-19 ENCOUNTER — Other Ambulatory Visit: Payer: Self-pay

## 2021-06-19 DIAGNOSIS — E7800 Pure hypercholesterolemia, unspecified: Secondary | ICD-10-CM

## 2021-06-19 MED ORDER — EPLERENONE 50 MG PO TABS: 50.0000 mg | ORAL_TABLET | Freq: Every day | ORAL | 3 refills | Status: DC

## 2021-06-19 MED ORDER — DOXAZOSIN MESYLATE 8 MG PO TABS
8.0000 mg | ORAL_TABLET | Freq: Every day | ORAL | 3 refills | Status: DC
Start: 2021-06-19 — End: 2022-06-07

## 2021-06-19 MED ORDER — AMLODIPINE BESYLATE 10 MG PO TABS: 10.0000 mg | ORAL_TABLET | Freq: Every day | ORAL | 3 refills | Status: DC

## 2021-06-19 NOTE — Assessment & Plan Note (Signed)
Mr. Charland is doing well in general.  He brings a log of his blood pressures showing that it is been very well have been controlled at home right around 130/80.  He is under a lot of stress right now because his wife's 64 year old father went to the hospital today and he is the caregiver for his 104 year old wife with dementia.  He is critically ill and may not be able to come home.  His wife is out of town and this is likely affecting his blood pressure.  He is going to keep tracking his pressure and if it does not come down at home he will give [place] a call.  Otherwise we will plan for 1 year follow-up given how well it has been controlled prior to this incident.  Continue amlodipine, clonidine, doxazosin, eplerenone, and telmisartan/HCTZ.

## 2021-06-19 NOTE — Assessment & Plan Note (Signed)
Continue rosuvastatin.  Lipids are managed with his primary care provider.

## 2021-06-19 NOTE — Progress Notes (Signed)
Advanced Hypertension Clinic Follow-up:   Date:  07/05/2021   ID:  Casey Hill, DOB 1956/06/16, MRN [id number]  PCP:  Josetta Huddle, MD  Cardiologist:  None  Electrophysiologist:  None   Evaluation Performed:  Follow-Up Visit  Chief Complaint:  hypertension   History of Present Illness:    Casey Hill is a 64 y.o. male with a hx of hypertension and hyperlipidemia here for follow-up.  Casey Hill was first diagnosed with hypertension approximately 15 years ago.  His blood pressure was previously well-controlled.  However in 4174 Bystolic was discontinued.  He was started on metoprolol and his blood pressure was not well have been controlled.  Therefore his PCP switched it to bisoprolol and it still remained elevated. He was diagnosed with ADD as an adult.  He has been on multiple different stimulant agents.  Most recently he has been taking dextroamphetamine.  He does not take it on the weekends.  Dr. [name] reduced the dose to 5 mg daily from 10 mg he notes a difference in his ability to get through his day successfully since reducing the dose.  There wasn't a significant change in his blood pressure on 5 mg versus 10 mg.  He does note that his blood pressure is better controlled on the weekends when he does not take it at all.    At his first appointment clonidine was switched to a patch.  Casey Hill was seen by our [facility] HTN pharmacists three times.  He was started on nebivolol and amlodipine was increased.  Bystolic was reduced to 10mg  due to bradycardia.  He noted that the Bystolic did not seem to really affect his blood pressure very much.  Spironolactone was added and then increased to 50mg .  Clonidine was also switched from tablets to a patch.  He was not tested for hyperaldosteronism.  However given that there is no change in his blood pressure after adding spironolactone, this was thought to be unlikely to be the on the underlying cause.  Spironolactone was increased to 50 mg on  6/29.  He has been enrolled in the remote patient monitoring system.  Overall he had seen an improvement in his blood pressures.  His blood pressure remained poorly controlled and nebivolol was costly.  This was discontinued and he was started on doxazosin.  His average blood pressure of on remote patient monitoring has been 129/81.  He completed the exercise program at the [facility]. At his last appointment his blood pressure was well controlled. Overall, he has been feeling okay. Lately he has been stressed as his father-in-law recently became ill. He presents a blood pressure log, which shows generally stable and well-controlled readings at home. At times he has a little bit of swelling in his LE that typically improves by morning. About 3-4 days a week he tries to exercise by walking (2 miles) or [facility] ([facility] He denies any palpitations, chest pain, or shortness of breath. No lightheadedness, headaches, syncope, orthopnea, PND, or exertional symptoms.  Previous antihypertensives: Metoprolol Nebivolol  Past Medical History:  Diagnosis Date   ADD (attention deficit disorder) 11/13/2019   Essential hypertension 11/13/2019   Pure hypercholesterolemia 11/13/2019    History reviewed. No pertinent surgical history.  Current Medications: Current Meds  Medication Sig   cholecalciferol (VITAMIN D3) 25 MCG (1000 UNIT) tablet Take 1,000 Units by mouth daily.   cloNIDine (CATAPRES - DOSED IN MG/24 HR) 0.2 mg/24hr patch APPLY 1 PATCH ON THE SKIN ONCE  A WEEK  (DISCONTINUE CLONIDINE ORAL TABLETS)   dextroamphetamine (DEXTROSTAT) 5 MG tablet Take 10 mg by mouth 2 (two) times daily.    Omega-3 Fatty Acids (FISH OIL) 1000 MG CAPS Take 1,000 mg by mouth in the morning and at bedtime.   rosuvastatin (CRESTOR) 20 MG tablet Take 20 mg by mouth daily.   telmisartan-hydrochlorothiazide (MICARDIS HCT) 80-25 MG tablet Take 1 tablet by mouth daily.   zolpidem (AMBIEN) 5 MG tablet Take 5 mg by mouth at bedtime  as needed for sleep.   [DISCONTINUED] amLODipine (NORVASC) 10 MG tablet Take 1 tablet (10 mg total) by mouth daily. Please schedule appt for future refills. 1st attempt   [DISCONTINUED] doxazosin (CARDURA) 8 MG tablet TAKE 1 TABLET AT BEDTIME   [DISCONTINUED] eplerenone (INSPRA) 50 MG tablet TAKE 1 TABLET(50 MG) BY MOUTH DAILY     Allergies:   Aldactone [spironolactone]   Social History   Socioeconomic History   Marital status: Married    Spouse name: Not on file   Number of children: Not on file   Years of education: Not on file   Highest education level: Not on file  Occupational History   Not on file  Tobacco Use   Smoking status: Never   Smokeless tobacco: Never  Substance and Sexual Activity   Alcohol use: Yes    Alcohol/week: 7.0 standard drinks    Types: 7 Glasses of wine per week   Drug use: Never   Sexual activity: Not on file  Other Topics Concern   Not on file  Social History Narrative   Not on file   Social Determinants of Health   Financial Resource Strain: Not on file  Food Insecurity: Not on file  Transportation Needs: Not on file  Physical Activity: Not on file  Stress: Not on file  Social Connections: Not on file     Family History: The patient's family history includes Glaucoma in his father and mother; Healthy in his brother; Heart attack in his father; Hyperlipidemia in his father; Hypertension in his father; Prostate cancer in his father; Stroke in his mother.  ROS:   Please see the history of present illness.    (+) Bilateral LE edema All other systems reviewed and are negative.  EKGs/Labs/Other Studies Reviewed:    Renal Artery Duplex 11/25/2019: Summary:  Renal:     Right: No evidence of right renal artery stenosis. RRV flow present.         Cyst(s) noted. Normal size right kidney. Normal right         Resisitive Index. Normal cortical thickness of right kidney.  Left:  No evidence of left renal artery stenosis. LRV flow present.          Normal size of left kidney. Normal left Resistive Index.         Normal cortical thickness of the left kidney.  Mesenteric:  Normal Celiac artery and Superior Mesenteric artery findings.   EKG:   06/19/2021: EKG was not ordered today. 06/06/2020: EKG was not ordered.    Recent Labs: No results found for requested labs within last 8760 hours.   Recent Lipid Panel No results found for: CHOL, TRIG, HDL, CHOLHDL, VLDL, LDLCALC, LDLDIRECT  Physical Exam:    VS:  BP (!) 150/82 (BP Location: Left Arm, Patient Position: Sitting, Cuff Size: Large)   Pulse 91   Ht 5' 11.5" (1.816 m)   Wt 250 lb 11.2 oz (113.7 kg)   SpO2 96%   BMI  34.48 kg/m  , BMI Body mass index is 34.48 kg/m. GENERAL:  Well appearing HEENT: Pupils equal round and reactive, fundi not visualized, oral mucosa unremarkable NECK:  No jugular venous distention, waveform within normal limits, carotid upstroke brisk and symmetric, no bruits, no thyromegaly LUNGS:  Clear to auscultation bilaterally HEART:  RRR.  PMI not displaced or sustained,S1 and S2 within normal limits, no S3, no S4, no clicks, no rubs, no murmurs ABD:  Flat, positive bowel sounds normal in frequency in pitch, no bruits, no rebound, no guarding, no midline pulsatile mass, no hepatomegaly, no splenomegaly EXT:  2 plus pulses throughout, no edema, no cyanosis no clubbing SKIN:  No rashes no nodules NEURO:  Cranial nerves II through XII grossly intact, motor grossly intact throughout PSYCH:  Cognitively intact, oriented to person place and time   ASSESSMENT:    1. Pure hypercholesterolemia      PLAN:    Essential hypertension Mr. Herder is doing well in general.  He brings a log of his blood pressures showing that it is been very well have been controlled at home right around 130/80.  He is under a lot of stress right now because his wife's 75 year old father went to the hospital today and he is the caregiver for his 58 year old wife with dementia.   He is critically ill and may not be able to come home.  His wife is out of town and this is likely affecting his blood pressure.  He is going to keep tracking his pressure and if it does not come down at home he will give [place] a call.  Otherwise we will plan for 1 year follow-up given how well it has been controlled prior to this incident.  Continue amlodipine, clonidine, doxazosin, eplerenone, and telmisartan/HCTZ.  Pure hypercholesterolemia Continue rosuvastatin.  Lipids are managed with his primary care provider.  Disposition:    FU with Bryndle Corredor C. Oval Linsey, MD, [facility] in 1 year.   Medication Adjustments/Labs and Tests Ordered: Current medicines are reviewed at length with the patient today.  Concerns regarding medicines are outlined above.   No orders of the defined types were placed in this encounter.  Meds ordered this encounter  Medications   amLODipine (NORVASC) 10 MG tablet    Sig: Take 1 tablet (10 mg total) by mouth daily.    Dispense:  90 tablet    Refill:  3   eplerenone (INSPRA) 50 MG tablet    Sig: Take 1 tablet (50 mg total) by mouth daily.    Dispense:  90 tablet    Refill:  3   doxazosin (CARDURA) 8 MG tablet    Sig: Take 1 tablet (8 mg total) by mouth at bedtime.    Dispense:  90 tablet    Refill:  3    Needs follow up appointment for further refills    I,Mathew Stumpf,acting as a scribe for Skeet Latch, MD.,have documented all relevant documentation on the behalf of Skeet Latch, MD,as directed by  Skeet Latch, MD while in the presence of Skeet Latch, MD.  I, [place] Oval Linsey, MD have reviewed all documentation for this visit.  The documentation of the exam, diagnosis, procedures, and orders on 07/05/2021 are all accurate and complete.   Signed, Skeet Latch, MD  07/05/2021 1:10 PM    [facility]

## 2021-06-19 NOTE — Patient Instructions (Signed)
Medication Instructions:  ?Your physician recommends that you continue on your current medications as directed. Please refer to the Current Medication list given to you today.  ? ?*If you need a refill on your cardiac medications before your next appointment, please call your pharmacy* ? ?Lab Work: ?NONE ? ?Testing/Procedures: ?NONE ? ?Follow-Up: ?At [facility], you and your health needs are our priority.  As part of our continuing mission to provide you with exceptional heart care, we have created designated Provider Care Teams.  These Care Teams include your primary Cardiologist (physician) and Advanced Practice Providers (APPs -  Physician Assistants and Nurse Practitioners) who all work together to provide you with the care you need, when you need it. ? ?We recommend signing up for the patient portal called "MyChart".  Sign up information is provided on this After Visit Summary.  MyChart is used to connect with patients for Virtual Visits (Telemedicine).  Patients are able to view lab/test results, encounter notes, upcoming appointments, etc.  Non-urgent messages can be sent to your provider as well.   ?To learn more about what you can do with MyChart, go to [email or web address].   ? ?Your next appointment:   ?12 month(s) ? ?The format for your next appointment:   ?In Person ? ?Provider:   ?Tiffany Woodbine, MD  ? ? ? ? ? ? ?

## 2021-07-05 ENCOUNTER — Encounter (HOSPITAL_BASED_OUTPATIENT_CLINIC_OR_DEPARTMENT_OTHER): Payer: Self-pay | Admitting: Cardiovascular Disease

## 2021-07-06 ENCOUNTER — Other Ambulatory Visit (HOSPITAL_BASED_OUTPATIENT_CLINIC_OR_DEPARTMENT_OTHER): Payer: Self-pay | Admitting: Cardiovascular Disease

## 2021-07-06 NOTE — Telephone Encounter (Signed)
Rx(s) sent to pharmacy electronically.  

## 2021-07-15 ENCOUNTER — Encounter (HOSPITAL_BASED_OUTPATIENT_CLINIC_OR_DEPARTMENT_OTHER): Payer: Self-pay | Admitting: Cardiovascular Disease

## 2021-07-17 DIAGNOSIS — H25811 Combined forms of age-related cataract, right eye: Secondary | ICD-10-CM | POA: Diagnosis not present

## 2021-07-17 DIAGNOSIS — H52203 Unspecified astigmatism, bilateral: Secondary | ICD-10-CM | POA: Diagnosis not present

## 2021-07-17 MED ORDER — CLONIDINE 0.2 MG/24HR TD PTWK: MEDICATED_PATCH | TRANSDERMAL | 0 refills | Status: DC

## 2021-07-27 NOTE — Telephone Encounter (Signed)
Patient seen 05/2021

## 2021-08-10 DIAGNOSIS — H25811 Combined forms of age-related cataract, right eye: Secondary | ICD-10-CM | POA: Diagnosis not present

## 2021-08-10 DIAGNOSIS — H2511 Age-related nuclear cataract, right eye: Secondary | ICD-10-CM | POA: Diagnosis not present

## 2021-08-14 ENCOUNTER — Other Ambulatory Visit (HOSPITAL_BASED_OUTPATIENT_CLINIC_OR_DEPARTMENT_OTHER): Payer: Self-pay | Admitting: Cardiovascular Disease

## 2021-09-15 ENCOUNTER — Other Ambulatory Visit: Payer: Self-pay | Admitting: Cardiovascular Disease

## 2021-09-19 DIAGNOSIS — N529 Male erectile dysfunction, unspecified: Secondary | ICD-10-CM | POA: Diagnosis not present

## 2021-09-19 DIAGNOSIS — C61 Malignant neoplasm of prostate: Secondary | ICD-10-CM | POA: Diagnosis not present

## 2021-09-19 DIAGNOSIS — F909 Attention-deficit hyperactivity disorder, unspecified type: Secondary | ICD-10-CM | POA: Diagnosis not present

## 2021-09-19 DIAGNOSIS — R7303 Prediabetes: Secondary | ICD-10-CM | POA: Diagnosis not present

## 2021-09-19 DIAGNOSIS — Z0000 Encounter for general adult medical examination without abnormal findings: Secondary | ICD-10-CM | POA: Diagnosis not present

## 2021-10-05 ENCOUNTER — Ambulatory Visit
Admission: RE | Admit: 2021-10-05 | Discharge: 2021-10-05 | Disposition: A | Discharge status: Home / Self Care | Payer: BC Managed Care – PPO | Source: Ambulatory Visit | Attending: Urology | Admitting: Urology

## 2021-10-05 DIAGNOSIS — R590 Localized enlarged lymph nodes: Secondary | ICD-10-CM | POA: Diagnosis not present

## 2021-10-05 DIAGNOSIS — R972 Elevated prostate specific antigen [PSA]: Secondary | ICD-10-CM | POA: Diagnosis not present

## 2021-10-05 DIAGNOSIS — N400 Enlarged prostate without lower urinary tract symptoms: Secondary | ICD-10-CM | POA: Diagnosis not present

## 2021-10-05 MED ORDER — GADOBENATE DIMEGLUMINE 529 MG/ML IV SOLN
20.0000 mL | Freq: Once | INTRAVENOUS | Status: AC | PRN
  Administered 2021-10-05: 20 mL via INTRAVENOUS

## 2021-10-06 ENCOUNTER — Other Ambulatory Visit: Payer: BC Managed Care – PPO

## 2021-10-13 DIAGNOSIS — R0683 Snoring: Secondary | ICD-10-CM | POA: Diagnosis not present

## 2021-10-13 DIAGNOSIS — R0681 Apnea, not elsewhere classified: Secondary | ICD-10-CM | POA: Diagnosis not present

## 2021-10-13 DIAGNOSIS — G478 Other sleep disorders: Secondary | ICD-10-CM | POA: Diagnosis not present

## 2021-10-16 DIAGNOSIS — C61 Malignant neoplasm of prostate: Secondary | ICD-10-CM | POA: Diagnosis not present

## 2021-10-23 DIAGNOSIS — C61 Malignant neoplasm of prostate: Secondary | ICD-10-CM | POA: Diagnosis not present

## 2021-10-30 ENCOUNTER — Other Ambulatory Visit: Payer: Self-pay | Admitting: Urology

## 2021-10-30 DIAGNOSIS — G4733 Obstructive sleep apnea (adult) (pediatric): Secondary | ICD-10-CM | POA: Diagnosis not present

## 2021-11-05 ENCOUNTER — Other Ambulatory Visit (HOSPITAL_BASED_OUTPATIENT_CLINIC_OR_DEPARTMENT_OTHER): Payer: Self-pay | Admitting: Cardiovascular Disease

## 2021-11-06 NOTE — Telephone Encounter (Signed)
Rx(s) sent to pharmacy electronically.  

## 2021-11-08 DIAGNOSIS — G4733 Obstructive sleep apnea (adult) (pediatric): Secondary | ICD-10-CM | POA: Diagnosis not present

## 2021-11-09 DIAGNOSIS — R278 Other lack of coordination: Secondary | ICD-10-CM | POA: Diagnosis not present

## 2021-11-10 ENCOUNTER — Other Ambulatory Visit (HOSPITAL_COMMUNITY): Payer: Self-pay | Admitting: Urology

## 2021-11-10 ENCOUNTER — Other Ambulatory Visit: Payer: Self-pay | Admitting: Urology

## 2021-11-10 DIAGNOSIS — C61 Malignant neoplasm of prostate: Secondary | ICD-10-CM

## 2021-11-10 NOTE — Progress Notes (Addendum)
DUE TO COVID-19 ONLY ONE VISITOR IS ALLOWED TO COME WITH YOU AND STAY IN THE WAITING ROOM ONLY DURING PRE OP AND PROCEDURE DAY OF SURGERY.  2 VISITOR  MAY VISIT WITH YOU AFTER SURGERY IN YOUR PRIVATE ROOM DURING VISITING HOURS ONLY! ?YOU MAY HAVE ONE PERSON SPEND THE NITE WITH YOU IN YOUR ROOM AFTER SURGERY.   ? ?YG.  ? ? Your procedure is scheduled on:  ? 12/04/2021.  ? Report to [facility] Main  Entrance ? ? Report to admitting at     0515           AM ?DO NOT [place], PICTURE ID OR WALLET DAY OF SURGERY.  ?  ? ? Call this number if you have problems the morning of surgery [phone]  ?Clear liquid diet the day before surgery.   ? ?DISSOLVE 17 grams  OF MIRALAX IN 4 OUNCES OF WATER AND DRINK AT 12 NOON DAY BEFORE SURGERY.   ? ?FLEETS ENEMA NITE BEFORE SURGERY   ? REMEMBER: NO  SOLID FOODS , CANDY, GUM OR MINTS AFTER MIDNITE THE NITE BEFORE SURGERY .       Marland Kitchen CLEAR LIQUIDS UNTIL   0415am             DAY OF SURGERY.     ? ? ?CLEAR LIQUID DIET ? ? ?Foods Allowed      ?WATER ?BLACK COFFEE ( SUGAR OK, NO MILK, CREAM OR CREAMER) REGULAR AND DECAF  ?TEA ( SUGAR OK NO MILK, CREAM, OR CREAMER) REGULAR AND DECAF  ?PLAIN JELLO ( NO RED)  ?FRUIT ICES ( NO RED, NO FRUIT PULP)  ?POPSICLES ( NO RED)  ?JUICE- APPLE, WHITE GRAPE AND WHITE CRANBERRY  ?SPORT DRINK LIKE GATORADE ( NO RED)  ?CLEAR BROTH ( VEGETABLE , CHICKEN OR BEEF)                                                               ? ?    ? ?BRUSH YOUR TEETH MORNING OF SURGERY AND RINSE YOUR MOUTH OUT, NO CHEWING GUM CANDY OR MINTS. ?  ? ? Take these medicines the morning of surgery with A SIP OF WATER:  AMLODIPINE, INSPRA  ? ? ?DO NOT TAKE ANY DIABETIC MEDICATIONS DAY OF YOUR SURGERY ?                  ?            You may not have any metal on your body including hair pins and  ?            piercings  Do not wear jewelry, make-up, lotions, powders or perfumes, deodorant ?            Do not wear nail polish on your fingernails.   ?           IF YOU  ARE A MALE AND WANT TO SHAVE UNDER ARMS OR LEGS PRIOR TO SURGERY YOU MUST DO SO AT LEAST 48 HOURS PRIOR TO SURGERY.  ?            Men may shave face and neck. ? ? Do not bring valuables to the hospital. [facility] NOT ?  RESPONSIBLE   FOR VALUABLES. ? Contacts, dentures or bridgework may not be worn into surgery. ? Leave suitcase in the car. After surgery it may be brought to your room. ? ?  ? Patients discharged the day of surgery will not be allowed to drive home. IF YOU ARE HAVING SURGERY AND GOING HOME THE SAME DAY, YOU MUST HAVE AN ADULT TO DRIVE YOU HOME AND BE WITH YOU FOR 24 HOURS. YOU MAY GO HOME BY TAXI OR UBER OR ORTHERWISE, BUT AN ADULT MUST ACCOMPANY YOU HOME AND STAY WITH YOU FOR 24 HOURS. ?  ? ?            Please read over the following fact sheets you were given: ?_____________________________________________________________________ ? ?[facility] - Preparing for Surgery ?Before surgery, you can play an important role.  Because skin is not sterile, your skin needs to be as free of germs as possible.  You can reduce the number of germs on your skin by washing with CHG (chlorahexidine gluconate) soap before surgery.  CHG is an antiseptic cleaner which kills germs and bonds with the skin to continue killing germs even after washing. ?Please DO NOT use if you have an allergy to CHG or antibacterial soaps.  If your skin becomes reddened/irritated stop using the CHG and inform your nurse when you arrive at Short Stay. ?Do not shave (including legs and underarms) for at least 48 hours prior to the first CHG shower.  You may shave your face/neck. ?Please follow these instructions carefully: ? 1.  Shower with CHG Soap the night before surgery and the  morning of Surgery. ? 2.  If you choose to wash your hair, wash your hair first as usual with your  normal  shampoo. ? 3.  After you shampoo, rinse your hair and body thoroughly to remove the  shampoo.                           4.  Use CHG as you  would any other liquid soap.  You can apply chg directly  to the skin and wash  ?                     Gently with a scrungie or clean washcloth. ? 5.  Apply the CHG Soap to your body ONLY FROM THE NECK DOWN.   Do not use on face/ open      ?                     Wound or open sores. Avoid contact with eyes, ears mouth and genitals (private parts).  ?                     Production manager,  Genitals (private parts) with your normal soap. ?            6.  Wash thoroughly, paying special attention to the area where your surgery  will be performed. ? 7.  Thoroughly rinse your body with warm water from the neck down. ? 8.  DO NOT shower/wash with your normal soap after using and rinsing off  the CHG Soap. ?               9.  Pat yourself dry with a clean towel. ?           10.  Wear clean pajamas. ?  11.  Place clean sheets on your bed the night of your first shower and do not  sleep with pets. ?Day of Surgery : ?Do not apply any lotions/deodorants the morning of surgery.  Please wear clean clothes to the hospital/surgery center. ? ?FAILURE TO FOLLOW THESE INSTRUCTIONS MAY RESULT IN THE CANCELLATION OF YOUR SURGERY ?PATIENT SIGNATURE_________________________________ ? ?NURSE SIGNATURE__________________________________ ? ?________________________________________________________________________  ? ? ?           ?

## 2021-11-13 ENCOUNTER — Other Ambulatory Visit: Payer: Self-pay

## 2021-11-13 ENCOUNTER — Encounter (HOSPITAL_COMMUNITY)
Admission: RE | Admit: 2021-11-13 | Discharge: 2021-11-13 | Disposition: A | Discharge status: Home / Self Care | Payer: BC Managed Care – PPO | Source: Ambulatory Visit | Attending: Urology | Admitting: Urology

## 2021-11-13 ENCOUNTER — Encounter (HOSPITAL_COMMUNITY): Payer: Self-pay

## 2021-11-13 VITALS — BP 152/79 | HR 93 | Temp 99.0°F | Resp 16 | Ht 71.5 in | Wt 248.0 lb

## 2021-11-13 DIAGNOSIS — I10 Essential (primary) hypertension: Secondary | ICD-10-CM | POA: Diagnosis not present

## 2021-11-13 DIAGNOSIS — Z01818 Encounter for other preprocedural examination: Secondary | ICD-10-CM | POA: Insufficient documentation

## 2021-11-13 HISTORY — DX: Pneumonia, unspecified organism: J18.9

## 2021-11-13 HISTORY — DX: Gastro-esophageal reflux disease without esophagitis: K21.9

## 2021-11-13 HISTORY — DX: Sleep apnea, unspecified: G47.30

## 2021-11-13 LAB — CBC
HCT: 44 % (ref 39.0–52.0)
Hemoglobin: 15.3 g/dL (ref 13.0–17.0)
MCH: 29.9 pg (ref 26.0–34.0)
MCHC: 34.8 g/dL (ref 30.0–36.0)
MCV: 86.1 fL (ref 80.0–100.0)
Platelets: 148 10*3/uL — ABNORMAL LOW (ref 150–400)
RBC: 5.11 MIL/uL (ref 4.22–5.81)
RDW: 12.4 % (ref 11.5–15.5)
WBC: 7.1 10*3/uL (ref 4.0–10.5)
nRBC: 0 % (ref 0.0–0.2)

## 2021-11-13 LAB — BASIC METABOLIC PANEL
Anion gap: 8 (ref 5–15)
BUN: 31 mg/dL — ABNORMAL HIGH (ref 8–23)
CO2: 25 mmol/L (ref 22–32)
Calcium: 9.5 mg/dL (ref 8.9–10.3)
Chloride: 103 mmol/L (ref 98–111)
Creatinine, Ser: 1.1 mg/dL (ref 0.61–1.24)
GFR, Estimated: >60 mL/min (ref >60)
Glucose, Bld: 149 mg/dL — ABNORMAL HIGH (ref 70–99)
Potassium: 3.6 mmol/L (ref 3.5–5.1)
Sodium: 136 mmol/L (ref 135–145)

## 2021-11-13 NOTE — Progress Notes (Addendum)
Anesthesia Review: ? ?PCP: DR Ancil Boozer  ?Called and requested most recent ov note.   ?LOV 09/19/21 on chart.  ?Cardiologist : none  ?Chest x-ray : ?EKG :11/13/21  DR [name] office to fax ekg from 2021.  EKG- 08/26/19 on chart  ?Echo : ?Stress test: ?Cardiac Cath :  ?Activity level: can do a flgiht of stairs without difficulty  ?Sleep Study/ CPAP :has cpap  ?Fasting Blood Sugar :      / Checks Blood Sugar -- times a day:   ?Blood Thinner/ Instructions /Last Dose: ?ASA / Instructions/ Last Dose :   ?

## 2021-12-01 ENCOUNTER — Encounter (HOSPITAL_COMMUNITY)
Admission: RE | Admit: 2021-12-01 | Discharge: 2021-12-01 | Disposition: A | Discharge status: Home / Self Care | Payer: BC Managed Care – PPO | Source: Ambulatory Visit | Attending: Urology | Admitting: Urology

## 2021-12-01 DIAGNOSIS — Z9049 Acquired absence of other specified parts of digestive tract: Secondary | ICD-10-CM | POA: Diagnosis not present

## 2021-12-01 DIAGNOSIS — R972 Elevated prostate specific antigen [PSA]: Secondary | ICD-10-CM | POA: Diagnosis not present

## 2021-12-01 DIAGNOSIS — N200 Calculus of kidney: Secondary | ICD-10-CM | POA: Diagnosis not present

## 2021-12-01 DIAGNOSIS — K5730 Diverticulosis of large intestine without perforation or abscess without bleeding: Secondary | ICD-10-CM | POA: Diagnosis not present

## 2021-12-01 DIAGNOSIS — I700 Atherosclerosis of aorta: Secondary | ICD-10-CM | POA: Diagnosis not present

## 2021-12-01 DIAGNOSIS — C61 Malignant neoplasm of prostate: Secondary | ICD-10-CM | POA: Diagnosis not present

## 2021-12-01 DIAGNOSIS — Z8546 Personal history of malignant neoplasm of prostate: Secondary | ICD-10-CM | POA: Diagnosis not present

## 2021-12-01 DIAGNOSIS — N281 Cyst of kidney, acquired: Secondary | ICD-10-CM | POA: Diagnosis not present

## 2021-12-01 DIAGNOSIS — I2510 Atherosclerotic heart disease of native coronary artery without angina pectoris: Secondary | ICD-10-CM | POA: Diagnosis not present

## 2021-12-01 IMAGING — NM NM BONE WHOLE BODY
2 series · 2 of 2 positions shown · non-contrast
Comparison: CT chest abdomen and pelvis dated [DATE]

CLINICAL DATA: History of prostate cancer.  PSA 5.7.

EXAM:
NUCLEAR MEDICINE WHOLE BODY BONE SCAN
TECHNIQUE: Whole body anterior and posterior images were obtained approximately
3 hours after intravenous injection of radiopharmaceutical.
RADIOPHARMACEUTICALS:  20.7 mCi [Y7] MDP IV

[Series 1: wbr_bone_40 whole body · 2.66mm/px · 1 of 1 slices shown (1 of 2)]
[im 1/1]
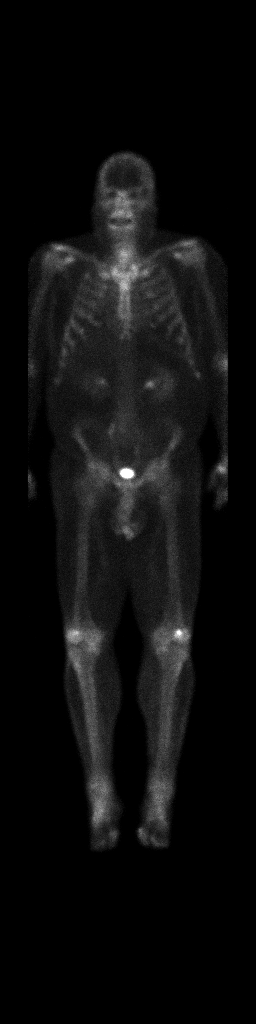

[Series 1: wbr_bone_40 whole body · 2.66mm/px · 1 of 1 slices shown (2 of 2)]
[im 1/1]
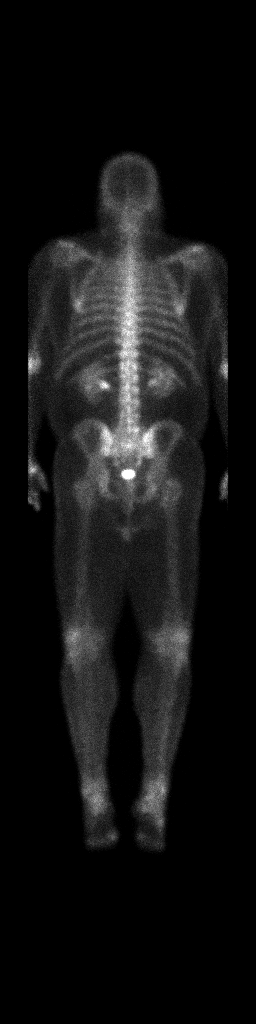

[2 of 2 positions shown; findings below may reference images not displayed]

FINDINGS: No convincing evidence of abnormal radiotracer uptake to suggest
osseous metastatic disease.

Multifocal radiotracer uptake involving the spine, SI joints,
shoulders, sternoclavicular joints, sternomanubrial joint, elbows,
wrists, hips, knees, ankles and feet in a pattern most consistent
with degenerative arthropathy.

Otherwise physiologic distribution of radiotracer activity.
IMPRESSION: No convincing scintigraphic evidence of osteoblastic metastatic
disease

## 2021-12-01 MED ORDER — TECHNETIUM TC 99M MEDRONATE IV KIT
20.0000 | PACK | Freq: Once | INTRAVENOUS | Status: AC | PRN
  Administered 2021-12-01: 20 via INTRAVENOUS

## 2021-12-01 NOTE — Anesthesia Preprocedure Evaluation (Addendum)
Anesthesia Evaluation  ?Patient identified by MRN, date of birth, ID band ?Patient awake ? ? ? ?Reviewed: ?Allergy & Precautions, NPO status , Patient's Chart, lab work & pertinent test results ? ?Airway ?Mallampati: II ? ?TM Distance: >3 FB ?Neck ROM: Full ? ? ? Dental ? ?(+) Partial Upper ?  ?Pulmonary ?sleep apnea and Continuous Positive Airway Pressure Ventilation ,  ?  ?Pulmonary exam normal ?breath sounds clear to auscultation ? ? ? ? ? ? Cardiovascular ?hypertension, Pt. on medications ?negative cardio ROS ?Normal cardiovascular exam ?Rhythm:Regular Rate:Normal ? ? ?  ?Neuro/Psych ?negative neurological ROS ?   ? GI/Hepatic ?Neg liver ROS, GERD  ,  ?Endo/Other  ?negative endocrine ROS ? Renal/GU ?negative Renal ROS  ?negative genitourinary ?  ?Musculoskeletal ?negative musculoskeletal ROS ?(+)  ? Abdominal ?  ?Peds ?negative pediatric ROS ?(+)  Hematology ?negative hematology ROS ?(+)   ?Anesthesia Other Findings ? ? Reproductive/Obstetrics ?negative OB ROS ? ?  ? ? ? ? ? ? ? ? ? ? ? ? ? ?  ?  ? ? ? ? ? ? ? ?Anesthesia Physical ?Anesthesia Plan ? ?ASA: 2 ? ?Anesthesia Plan: General  ? ?Post-op Pain Management: Tylenol PO (pre-op)*  ? ?Induction: Intravenous ? ?PONV Risk Score and Plan: 2 and Treatment may vary due to age or medical condition, Midazolam, Dexamethasone and Ondansetron ? ?Airway Management Planned: Oral ETT ? ?Additional Equipment:  ? ?Intra-op Plan:  ? ?Post-operative Plan: Extubation in OR ? ?Informed Consent: I have reviewed the patients History and Physical, chart, labs and discussed the procedure including the risks, benefits and alternatives for the proposed anesthesia with the patient or authorized representative who has indicated his/her understanding and acceptance.  ? ? ? ?Dental advisory given ? ?Plan Discussed with: Anesthesiologist and CRNA ? ?Anesthesia Plan Comments:   ? ? ? ? ? ?Anesthesia Quick Evaluation ? ?

## 2021-12-04 ENCOUNTER — Other Ambulatory Visit: Payer: Self-pay

## 2021-12-04 ENCOUNTER — Encounter (HOSPITAL_COMMUNITY): Payer: Self-pay | Admitting: Urology

## 2021-12-04 ENCOUNTER — Ambulatory Visit (HOSPITAL_COMMUNITY): Payer: BC Managed Care – PPO | Admitting: Physician Assistant

## 2021-12-04 ENCOUNTER — Ambulatory Visit (HOSPITAL_COMMUNITY): Payer: BC Managed Care – PPO | Admitting: Anesthesiology

## 2021-12-04 ENCOUNTER — Encounter (HOSPITAL_COMMUNITY)
Admission: RE | Disposition: A | Discharge status: Home / Self Care | Payer: Self-pay | Source: Ambulatory Visit | Attending: Urology

## 2021-12-04 ENCOUNTER — Observation Stay (HOSPITAL_COMMUNITY)
Admission: RE | Admit: 2021-12-04 | Discharge: 2021-12-05 | Disposition: A | Discharge status: Home / Self Care | Payer: BC Managed Care – PPO | Source: Ambulatory Visit | Attending: Urology | Admitting: Urology

## 2021-12-04 DIAGNOSIS — C61 Malignant neoplasm of prostate: Principal | ICD-10-CM | POA: Diagnosis present

## 2021-12-04 DIAGNOSIS — I10 Essential (primary) hypertension: Secondary | ICD-10-CM | POA: Diagnosis not present

## 2021-12-04 DIAGNOSIS — Z79899 Other long term (current) drug therapy: Secondary | ICD-10-CM | POA: Diagnosis not present

## 2021-12-04 HISTORY — PX: ROBOT ASSISTED LAPAROSCOPIC RADICAL PROSTATECTOMY: SHX5141

## 2021-12-04 HISTORY — PX: LYMPHADENECTOMY: SHX5960

## 2021-12-04 LAB — TYPE AND SCREEN
ABO/RH(D): B NEG
Antibody Screen: NEGATIVE

## 2021-12-04 LAB — ABO/RH: ABO/RH(D): B NEG

## 2021-12-04 LAB — HEMOGLOBIN AND HEMATOCRIT, BLOOD
HCT: 39.7 % (ref 39.0–52.0)
Hemoglobin: 13.9 g/dL (ref 13.0–17.0)

## 2021-12-04 SURGERY — XI ROBOTIC ASSISTED LAPAROSCOPIC RADICAL PROSTATECTOMY LEVEL 2
Anesthesia: General

## 2021-12-04 MED ORDER — KETAMINE HCL 10 MG/ML IJ SOLN
INTRAMUSCULAR | Status: AC
  Filled 2021-12-04: qty 1

## 2021-12-04 MED ORDER — DOCUSATE SODIUM 100 MG PO CAPS: 100.0000 mg | ORAL_CAPSULE | Freq: Two times a day (BID) | ORAL | Status: DC

## 2021-12-04 MED ORDER — ROCURONIUM BROMIDE 10 MG/ML (PF) SYRINGE
PREFILLED_SYRINGE | INTRAVENOUS | Status: AC
  Filled 2021-12-04: qty 10

## 2021-12-04 MED ORDER — ONDANSETRON HCL 4 MG/2ML IJ SOLN: 4.0000 mg | Freq: Once | INTRAMUSCULAR | Status: DC | PRN

## 2021-12-04 MED ORDER — FENTANYL CITRATE PF 50 MCG/ML IJ SOSY: 25.0000 ug | PREFILLED_SYRINGE | INTRAMUSCULAR | Status: DC | PRN

## 2021-12-04 MED ORDER — ONDANSETRON HCL 4 MG/2ML IJ SOLN
4.0000 mg | INTRAMUSCULAR | Status: DC | PRN
  Administered 2021-12-05: 4 mg via INTRAVENOUS
  Filled 2021-12-04: qty 2

## 2021-12-04 MED ORDER — EPHEDRINE SULFATE-NACL 50-0.9 MG/10ML-% IV SOSY
PREFILLED_SYRINGE | INTRAVENOUS | Status: DC | PRN
  Administered 2021-12-04: 7.5 mg via INTRAVENOUS
  Administered 2021-12-04: 5 mg via INTRAVENOUS

## 2021-12-04 MED ORDER — DIPHENHYDRAMINE HCL 12.5 MG/5ML PO ELIX: 12.5000 mg | ORAL_SOLUTION | Freq: Four times a day (QID) | ORAL | Status: DC | PRN

## 2021-12-04 MED ORDER — FENTANYL CITRATE (PF) 100 MCG/2ML IJ SOLN
INTRAMUSCULAR | Status: DC | PRN
Start: 2021-12-04 — End: 2021-12-04
  Administered 2021-12-04: 100 ug via INTRAVENOUS
  Administered 2021-12-04: 50 ug via INTRAVENOUS
  Administered 2021-12-04: 100 ug via INTRAVENOUS

## 2021-12-04 MED ORDER — LIDOCAINE 2% (20 MG/ML) 5 ML SYRINGE
INTRAMUSCULAR | Status: DC | PRN
  Administered 2021-12-04: 100 mg via INTRAVENOUS

## 2021-12-04 MED ORDER — BACITRACIN-NEOMYCIN-POLYMYXIN 400-5-5000 EX OINT: 1.0000 "application " | TOPICAL_OINTMENT | Freq: Three times a day (TID) | CUTANEOUS | Status: DC | PRN

## 2021-12-04 MED ORDER — SULFAMETHOXAZOLE-TRIMETHOPRIM 800-160 MG PO TABS: 1.0000 | ORAL_TABLET | Freq: Two times a day (BID) | ORAL | 0 refills | Status: DC

## 2021-12-04 MED ORDER — SUCCINYLCHOLINE CHLORIDE 200 MG/10ML IV SOSY
PREFILLED_SYRINGE | INTRAVENOUS | Status: AC
  Filled 2021-12-04: qty 10

## 2021-12-04 MED ORDER — CEFAZOLIN SODIUM-DEXTROSE 2-4 GM/100ML-% IV SOLN
2.0000 g | Freq: Once | INTRAVENOUS | Status: AC
  Administered 2021-12-04: 2 g via INTRAVENOUS
  Filled 2021-12-04: qty 100

## 2021-12-04 MED ORDER — POLYETHYLENE GLYCOL 3350 17 G PO PACK: 17.0000 g | PACK | Freq: Once | ORAL | Status: DC

## 2021-12-04 MED ORDER — BUPIVACAINE-EPINEPHRINE (PF) 0.25% -1:200000 IJ SOLN
INTRAMUSCULAR | Status: DC | PRN
  Administered 2021-12-04: 30 mL

## 2021-12-04 MED ORDER — MIDAZOLAM HCL 2 MG/2ML IJ SOLN
INTRAMUSCULAR | Status: AC
  Filled 2021-12-04: qty 2

## 2021-12-04 MED ORDER — GLYCOPYRROLATE 0.2 MG/ML IJ SOLN
INTRAMUSCULAR | Status: DC | PRN
  Administered 2021-12-04: .2 mg via INTRAVENOUS

## 2021-12-04 MED ORDER — ROCURONIUM BROMIDE 10 MG/ML (PF) SYRINGE
PREFILLED_SYRINGE | INTRAVENOUS | Status: DC | PRN
  Administered 2021-12-04: 30 mg via INTRAVENOUS
  Administered 2021-12-04: 100 mg via INTRAVENOUS
  Administered 2021-12-04: 30 mg via INTRAVENOUS

## 2021-12-04 MED ORDER — TRAMADOL HCL 50 MG PO TABS: 50.0000 mg | ORAL_TABLET | Freq: Four times a day (QID) | ORAL | 0 refills | Status: DC | PRN

## 2021-12-04 MED ORDER — HEPARIN SODIUM (PORCINE) 1000 UNIT/ML IJ SOLN
INTRAMUSCULAR | Status: AC
  Filled 2021-12-04: qty 1

## 2021-12-04 MED ORDER — PROPOFOL 10 MG/ML IV BOLUS
INTRAVENOUS | Status: AC
Start: 2021-12-04 — End: ?
  Filled 2021-12-04: qty 20

## 2021-12-04 MED ORDER — SPIRONOLACTONE 25 MG PO TABS
25.0000 mg | ORAL_TABLET | Freq: Every day | ORAL | Status: DC
  Administered 2021-12-05: 25 mg via ORAL
  Filled 2021-12-04: qty 1

## 2021-12-04 MED ORDER — PHENYLEPHRINE HCL (PRESSORS) 10 MG/ML IV SOLN
INTRAVENOUS | Status: AC
  Filled 2021-12-04: qty 1

## 2021-12-04 MED ORDER — DOCUSATE SODIUM 100 MG PO CAPS
100.0000 mg | ORAL_CAPSULE | Freq: Two times a day (BID) | ORAL | Status: DC
  Administered 2021-12-04 – 2021-12-05 (×2): 100 mg via ORAL
  Filled 2021-12-04 (×2): qty 1

## 2021-12-04 MED ORDER — KETAMINE HCL 10 MG/ML IJ SOLN
INTRAMUSCULAR | Status: DC | PRN
  Administered 2021-12-04: 30 mg via INTRAVENOUS

## 2021-12-04 MED ORDER — FLEET ENEMA 7-19 GM/118ML RE ENEM: 1.0000 | ENEMA | Freq: Once | RECTAL | Status: DC

## 2021-12-04 MED ORDER — KETOROLAC TROMETHAMINE 15 MG/ML IJ SOLN
15.0000 mg | Freq: Four times a day (QID) | INTRAMUSCULAR | Status: DC
  Administered 2021-12-04 – 2021-12-05 (×3): 15 mg via INTRAVENOUS
  Filled 2021-12-04 (×2): qty 1

## 2021-12-04 MED ORDER — HYDROCHLOROTHIAZIDE 25 MG PO TABS
25.0000 mg | ORAL_TABLET | Freq: Every day | ORAL | Status: DC
  Administered 2021-12-05: 25 mg via ORAL
  Filled 2021-12-04: qty 1

## 2021-12-04 MED ORDER — FAMOTIDINE 20 MG PO TABS
10.0000 mg | ORAL_TABLET | Freq: Once | ORAL | Status: AC
Start: 2021-12-05 — End: 2021-12-05
  Administered 2021-12-05: 10 mg via ORAL
  Filled 2021-12-04: qty 1

## 2021-12-04 MED ORDER — CHLORHEXIDINE GLUCONATE 0.12 % MT SOLN
15.0000 mL | Freq: Once | OROMUCOSAL | Status: AC
  Administered 2021-12-04: 15 mL via OROMUCOSAL

## 2021-12-04 MED ORDER — TELMISARTAN-HCTZ 80-25 MG PO TABS: 1.0000 | ORAL_TABLET | Freq: Every day | ORAL | Status: DC

## 2021-12-04 MED ORDER — ONDANSETRON HCL 4 MG/2ML IJ SOLN
INTRAMUSCULAR | Status: DC | PRN
  Administered 2021-12-04: 4 mg via INTRAVENOUS

## 2021-12-04 MED ORDER — ALBUTEROL SULFATE HFA 108 (90 BASE) MCG/ACT IN AERS
INHALATION_SPRAY | RESPIRATORY_TRACT | Status: DC | PRN
  Administered 2021-12-04: 4 via RESPIRATORY_TRACT
  Administered 2021-12-04: 2 via RESPIRATORY_TRACT

## 2021-12-04 MED ORDER — ROSUVASTATIN CALCIUM 20 MG PO TABS
20.0000 mg | ORAL_TABLET | Freq: Every day | ORAL | Status: DC
  Administered 2021-12-05: 20 mg via ORAL
  Filled 2021-12-04: qty 1

## 2021-12-04 MED ORDER — EPHEDRINE 5 MG/ML INJ
INTRAVENOUS | Status: AC
  Filled 2021-12-04: qty 5

## 2021-12-04 MED ORDER — KETOROLAC TROMETHAMINE 15 MG/ML IJ SOLN
INTRAMUSCULAR | Status: AC
  Administered 2021-12-05: 15 mg
  Filled 2021-12-04: qty 1

## 2021-12-04 MED ORDER — PHENYLEPHRINE HCL-NACL 20-0.9 MG/250ML-% IV SOLN
INTRAVENOUS | Status: DC | PRN
  Administered 2021-12-04: 30 ug/min via INTRAVENOUS

## 2021-12-04 MED ORDER — FENTANYL CITRATE (PF) 250 MCG/5ML IJ SOLN
INTRAMUSCULAR | Status: AC
  Filled 2021-12-04: qty 5

## 2021-12-04 MED ORDER — SUCCINYLCHOLINE CHLORIDE 200 MG/10ML IV SOSY
PREFILLED_SYRINGE | INTRAVENOUS | Status: DC | PRN
  Administered 2021-12-04: 100 mg via INTRAVENOUS

## 2021-12-04 MED ORDER — LACTATED RINGERS IV SOLN: INTRAVENOUS | Status: DC

## 2021-12-04 MED ORDER — AMLODIPINE BESYLATE 10 MG PO TABS
10.0000 mg | ORAL_TABLET | Freq: Every day | ORAL | Status: DC
  Administered 2021-12-04 – 2021-12-05 (×2): 10 mg via ORAL
  Filled 2021-12-04 (×2): qty 1

## 2021-12-04 MED ORDER — SODIUM CHLORIDE 0.9 % IR SOLN
Status: DC | PRN
  Administered 2021-12-04: 1000 mL

## 2021-12-04 MED ORDER — ONDANSETRON HCL 4 MG/2ML IJ SOLN
INTRAMUSCULAR | Status: AC
  Filled 2021-12-04: qty 2

## 2021-12-04 MED ORDER — LACTATED RINGERS IV SOLN: INTRAVENOUS | Status: DC | PRN

## 2021-12-04 MED ORDER — DOXAZOSIN MESYLATE 4 MG PO TABS
8.0000 mg | ORAL_TABLET | Freq: Every day | ORAL | Status: DC
  Administered 2021-12-04: 8 mg via ORAL
  Filled 2021-12-04: qty 2

## 2021-12-04 MED ORDER — MORPHINE SULFATE (PF) 2 MG/ML IV SOLN
INTRAVENOUS | Status: AC
  Filled 2021-12-04: qty 1

## 2021-12-04 MED ORDER — OXYCODONE HCL 5 MG/5ML PO SOLN: 5.0000 mg | Freq: Once | ORAL | Status: DC | PRN

## 2021-12-04 MED ORDER — ACETAMINOPHEN 500 MG PO TABS
1000.0000 mg | ORAL_TABLET | Freq: Once | ORAL | Status: AC
  Administered 2021-12-04: 1000 mg via ORAL
  Filled 2021-12-04: qty 2

## 2021-12-04 MED ORDER — ORAL CARE MOUTH RINSE: 15.0000 mL | Freq: Once | OROMUCOSAL | Status: AC

## 2021-12-04 MED ORDER — MIDAZOLAM HCL 5 MG/5ML IJ SOLN
INTRAMUSCULAR | Status: DC | PRN
Start: 2021-12-04 — End: 2021-12-04
  Administered 2021-12-04: 2 mg via INTRAVENOUS

## 2021-12-04 MED ORDER — STERILE WATER FOR IRRIGATION IR SOLN
Status: DC | PRN
Start: 2021-12-04 — End: 2021-12-04
  Administered 2021-12-04: 1000 mL

## 2021-12-04 MED ORDER — AMISULPRIDE (ANTIEMETIC) 5 MG/2ML IV SOLN: 10.0000 mg | Freq: Once | INTRAVENOUS | Status: DC | PRN

## 2021-12-04 MED ORDER — DIPHENHYDRAMINE HCL 50 MG/ML IJ SOLN: 12.5000 mg | Freq: Four times a day (QID) | INTRAMUSCULAR | Status: DC | PRN

## 2021-12-04 MED ORDER — PROPOFOL 10 MG/ML IV BOLUS
INTRAVENOUS | Status: DC | PRN
  Administered 2021-12-04: 150 mg via INTRAVENOUS

## 2021-12-04 MED ORDER — SUGAMMADEX SODIUM 200 MG/2ML IV SOLN
INTRAVENOUS | Status: DC | PRN
  Administered 2021-12-04: 400 mg via INTRAVENOUS

## 2021-12-04 MED ORDER — LACTATED RINGERS IV SOLN
INTRAVENOUS | Status: DC | PRN
  Administered 2021-12-04: 1000 mL

## 2021-12-04 MED ORDER — IRBESARTAN 300 MG PO TABS
300.0000 mg | ORAL_TABLET | Freq: Every day | ORAL | Status: DC
Start: 2021-12-05 — End: 2021-12-05
  Administered 2021-12-05: 300 mg via ORAL
  Filled 2021-12-04: qty 1

## 2021-12-04 MED ORDER — CLONIDINE HCL 0.2 MG/24HR TD PTWK: 0.2000 mg | MEDICATED_PATCH | TRANSDERMAL | Status: DC

## 2021-12-04 MED ORDER — BUPIVACAINE-EPINEPHRINE (PF) 0.25% -1:200000 IJ SOLN
INTRAMUSCULAR | Status: AC
  Filled 2021-12-04: qty 30

## 2021-12-04 MED ORDER — MORPHINE SULFATE (PF) 2 MG/ML IV SOLN
2.0000 mg | INTRAVENOUS | Status: DC | PRN
  Administered 2021-12-04: 2 mg via INTRAVENOUS

## 2021-12-04 MED ORDER — CEFAZOLIN SODIUM-DEXTROSE 1-4 GM/50ML-% IV SOLN
1.0000 g | Freq: Three times a day (TID) | INTRAVENOUS | Status: AC
  Administered 2021-12-04 – 2021-12-05 (×2): 1 g via INTRAVENOUS
  Filled 2021-12-04 (×2): qty 50

## 2021-12-04 MED ORDER — ZOLPIDEM TARTRATE 5 MG PO TABS
5.0000 mg | ORAL_TABLET | Freq: Every evening | ORAL | Status: DC | PRN
  Administered 2021-12-04: 5 mg via ORAL
  Filled 2021-12-04: qty 1

## 2021-12-04 MED ORDER — DEXAMETHASONE SODIUM PHOSPHATE 10 MG/ML IJ SOLN
INTRAMUSCULAR | Status: DC | PRN
  Administered 2021-12-04: 10 mg via INTRAVENOUS

## 2021-12-04 MED ORDER — SODIUM CHLORIDE 0.9 % IV BOLUS
1000.0000 mL | Freq: Once | INTRAVENOUS | Status: AC
  Administered 2021-12-04: 1000 mL via INTRAVENOUS

## 2021-12-04 MED ORDER — DEXAMETHASONE SODIUM PHOSPHATE 10 MG/ML IJ SOLN
INTRAMUSCULAR | Status: AC
  Filled 2021-12-04: qty 1

## 2021-12-04 MED ORDER — ACETAMINOPHEN 325 MG PO TABS
650.0000 mg | ORAL_TABLET | ORAL | Status: DC | PRN
  Administered 2021-12-05: 650 mg via ORAL
  Filled 2021-12-04: qty 2

## 2021-12-04 MED ORDER — OXYCODONE HCL 5 MG PO TABS: 5.0000 mg | ORAL_TABLET | Freq: Once | ORAL | Status: DC | PRN

## 2021-12-04 MED ORDER — KCL IN DEXTROSE-NACL 20-5-0.45 MEQ/L-%-% IV SOLN
INTRAVENOUS | Status: DC
  Filled 2021-12-04 (×3): qty 1000

## 2021-12-04 SURGICAL SUPPLY — 67 items
ADH SKN CLS APL DERMABOND .7 (GAUZE/BANDAGES/DRESSINGS) ×2
APL PRP STRL LF DISP 70% ISPRP (MISCELLANEOUS) ×2
APL SWBSTK 6 STRL LF DISP (MISCELLANEOUS) ×2
APPLICATOR COTTON TIP 6 STRL (MISCELLANEOUS) ×3 IMPLANT
APPLICATOR COTTON TIP 6IN STRL (MISCELLANEOUS) ×3
BAG COUNTER SPONGE SURGICOUNT (BAG) IMPLANT
BAG SPNG CNTER NS LX DISP (BAG)
CATH FOLEY 2WAY SLVR 18FR 30CC (CATHETERS) ×4 IMPLANT
CATH ROBINSON RED A/P 16FR (CATHETERS) ×4 IMPLANT
CATH ROBINSON RED A/P 8FR (CATHETERS) ×4 IMPLANT
CATH TIEMANN FOLEY 18FR 5CC (CATHETERS) ×4 IMPLANT
CHLORAPREP W/TINT 26 (MISCELLANEOUS) ×4 IMPLANT
CLIP LIGATING HEM O LOK PURPLE (MISCELLANEOUS) ×10 IMPLANT
COVER SURGICAL LIGHT HANDLE (MISCELLANEOUS) ×4 IMPLANT
COVER TIP SHEARS 8 DVNC (MISCELLANEOUS) ×3 IMPLANT
COVER TIP SHEARS 8MM DA VINCI (MISCELLANEOUS) ×3
CUTTER ECHEON FLEX ENDO 45 340 (ENDOMECHANICALS) ×3 IMPLANT
CUTTER FLEX LINEAR 45M (STAPLE) ×1 IMPLANT
DERMABOND ADVANCED (GAUZE/BANDAGES/DRESSINGS) ×1
DERMABOND ADVANCED .7 DNX12 (GAUZE/BANDAGES/DRESSINGS) ×3 IMPLANT
DRAIN CHANNEL RND F F (WOUND CARE) IMPLANT
DRAPE ARM DVNC X/XI (DISPOSABLE) ×12 IMPLANT
DRAPE COLUMN DVNC XI (DISPOSABLE) ×3 IMPLANT
DRAPE DA VINCI XI ARM (DISPOSABLE) ×12
DRAPE DA VINCI XI COLUMN (DISPOSABLE) ×3
DRAPE SURG IRRIG POUCH 19X23 (DRAPES) ×4 IMPLANT
DRSG TEGADERM 4X4.75 (GAUZE/BANDAGES/DRESSINGS) ×4 IMPLANT
ELECT PENCIL ROCKER SW 15FT (MISCELLANEOUS) ×4 IMPLANT
ELECT REM PT RETURN 15FT ADLT (MISCELLANEOUS) ×4 IMPLANT
GAUZE 4X4 16PLY ~~LOC~~+RFID DBL (SPONGE) ×4 IMPLANT
GAUZE SPONGE 4X4 12PLY STRL (GAUZE/BANDAGES/DRESSINGS) ×4 IMPLANT
GLOVE BIO SURGEON STRL SZ 6.5 (GLOVE) ×4 IMPLANT
GLOVE SURG LX 7.5 STRW (GLOVE) ×2
GLOVE SURG LX STRL 7.5 STRW (GLOVE) ×6 IMPLANT
HOLDER FOLEY CATH W/STRAP (MISCELLANEOUS) ×4 IMPLANT
IRRIG SUCT STRYKERFLOW 2 WTIP (MISCELLANEOUS) ×3
IRRIGATION SUCT STRKRFLW 2 WTP (MISCELLANEOUS) ×3 IMPLANT
IV LACTATED RINGERS 1000ML (IV SOLUTION) ×4 IMPLANT
KIT TURNOVER KIT A (KITS) IMPLANT
NDL SAFETY ECLIPSE 18X1.5 (NEEDLE) ×3 IMPLANT
NEEDLE HYPO 18GX1.5 SHARP (NEEDLE) ×3
PACK ROBOT UROLOGY CUSTOM (CUSTOM PROCEDURE TRAY) ×4 IMPLANT
RELOAD 45 THICK GREEN (ENDOMECHANICALS) ×3 IMPLANT
RELOAD STAPLE 45 4.1 GRN THCK (STAPLE) ×3 IMPLANT
RELOAD STAPLE 45 GRN THCK ETS (ENDOMECHANICALS) IMPLANT
SEAL CANN UNIV 5-8 DVNC XI (MISCELLANEOUS) ×12 IMPLANT
SEAL XI 5MM-8MM UNIVERSAL (MISCELLANEOUS) ×12
SET TUBE SMOKE EVAC HIGH FLOW (TUBING) ×4 IMPLANT
SOLUTION ELECTROLUBE (MISCELLANEOUS) ×4 IMPLANT
SPIKE FLUID TRANSFER (MISCELLANEOUS) ×4 IMPLANT
STAPLE RELOAD 45 GRN (STAPLE) IMPLANT
STAPLE RELOAD 45MM GREEN (STAPLE)
SUT ETHILON 3 0 PS 1 (SUTURE) ×4 IMPLANT
SUT MNCRL 3 0 RB1 (SUTURE) ×3 IMPLANT
SUT MNCRL 3 0 VIOLET RB1 (SUTURE) ×3 IMPLANT
SUT MNCRL AB 4-0 PS2 18 (SUTURE) ×8 IMPLANT
SUT MONOCRYL 3 0 RB1 (SUTURE) ×6
SUT PDS PLUS 0 (SUTURE) ×6
SUT PDS PLUS AB 0 CT-2 (SUTURE) ×6 IMPLANT
SUT VIC AB 0 CT1 27 (SUTURE) ×6
SUT VIC AB 0 CT1 27XBRD ANTBC (SUTURE) ×6 IMPLANT
SUT VIC AB 2-0 SH 27 (SUTURE) ×6
SUT VIC AB 2-0 SH 27X BRD (SUTURE) ×3 IMPLANT
SYR 27GX1/2 1ML LL SAFETY (SYRINGE) ×4 IMPLANT
TOWEL OR NON WOVEN STRL DISP B (DISPOSABLE) ×4 IMPLANT
TROCAR XCEL NON-BLD 5MMX100MML (ENDOMECHANICALS) IMPLANT
WATER STERILE IRR 1000ML POUR (IV SOLUTION) ×4 IMPLANT

## 2021-12-04 NOTE — Anesthesia Postprocedure Evaluation (Signed)
Anesthesia Post Note ? ?Patient: Casey Hill ? ?Procedure(s) Performed: XI ROBOTIC ASSISTED LAPAROSCOPIC RADICAL PROSTATECTOMY LEVEL 2 ?LYMPHADENECTOMY, PELVIC (Bilateral) ? ?  ? ?Patient location during evaluation: PACU ?Anesthesia Type: General ?Level of consciousness: awake ?Pain management: pain level controlled ?Vital Signs Assessment: post-procedure vital signs reviewed and stable ?Respiratory status: spontaneous breathing and respiratory function stable ?Cardiovascular status: stable ?Postop Assessment: no apparent nausea or vomiting ?Anesthetic complications: no ? ? ?No notable events documented. ? ?Last Vitals:  ?Vitals:  ? 12/04/21 1230 12/04/21 1245  ?BP: (!) 103/57   ?Pulse: 62   ?Resp: 14 18  ?Temp: (!) 36.4 ?C   ?SpO2: 97%   ?  ?Last Pain:  ?Vitals:  ? 12/04/21 1245  ?TempSrc:   ?PainSc: 0-No pain  ? ? ?  ?  ?  ?  ?  ?  ? ?Merlinda Frederick ? ? ? ? ?

## 2021-12-04 NOTE — Transfer of Care (Signed)
Immediate Anesthesia Transfer of Care Note ? ?Patient: Casey Hill ? ?Procedure(s) Performed: XI ROBOTIC ASSISTED LAPAROSCOPIC RADICAL PROSTATECTOMY LEVEL 2 ?LYMPHADENECTOMY, PELVIC (Bilateral) ? ?Patient Location: PACU ? ?Anesthesia Type:General ? ?Level of Consciousness: awake, alert , oriented and patient cooperative ? ?Airway & Oxygen Therapy: Patient Spontanous Breathing and Patient connected to face mask oxygen ? ?Post-op Assessment: Report given to RN and Post -op Vital signs reviewed and stable ? ?Post vital signs: Reviewed and stable ? ?Last Vitals:  ?Vitals Value Taken Time  ?BP 102/59 12/04/21 1115  ?Temp    ?Pulse 56 12/04/21 1115  ?Resp 19 12/04/21 1115  ?SpO2 90 % 12/04/21 1115  ?Vitals shown include unvalidated device data. ? ?Last Pain:  ?Vitals:  ? 12/04/21 0613  ?TempSrc: Oral  ?PainSc:   ?   ? ?  ? ?Complications: No notable events documented. ?

## 2021-12-04 NOTE — Discharge Instructions (Signed)

## 2021-12-04 NOTE — Progress Notes (Signed)
Patient ID: POWELL HALBERT, male   DOB: August 16, 1956, 66 y.o.   MRN: [id number] ? ?Post-op note ? ?Subjective: ?The patient is doing well.  No complaints. ? ?Objective: ?Vital signs in last 24 hours: ?Temp:  [96.1 ?F (35.6 ?C)-98 ?F (36.7 ?C)] 97.5 ?F (36.4 ?C) (04/17 1345) ?Pulse Rate:  [50-70] 55 (04/17 1500) ?Resp:  [10-19] 13 (04/17 1500) ?BP: (92-153)/(51-89) 103/62 (04/17 1500) ?SpO2:  [90 %-100 %] 95 % (04/17 1500) ?Weight:  [112.5 kg] 112.5 kg (04/17 2035) ? ?Intake/Output from previous day: ?No intake/output data recorded. ?Intake/Output this shift: ?Total I/O ?In: 1900 [I.V.:1800; IV Piggyback:100] ?Out: 150 [Blood:150] ? ?Physical Exam:  ?General: Alert and oriented. ?Abdomen: Soft, Nondistended. ?Incisions: Clean and dry. ?GU: Urine clearing. ? ?Lab Results: ?Recent Labs  ?  12/04/21 ?1202  ?HGB 13.9  ?HCT 39.7  ? ? ?Assessment/Plan: ?POD#0  ? ?1) Continue to monitor, ambulate, IS ? ? ?Pryor Curia MD ? ? LOS: 0 days  ? ?Les Alinda Money ?12/04/2021, 3:25 PM ? ? ?   ?

## 2021-12-04 NOTE — Op Note (Signed)
Preoperative diagnosis: Clinically localized adenocarcinoma of the prostate (clinical stage T1c N0 M0) ? ?Postoperative diagnosis: Clinically localized adenocarcinoma of the prostate (clinical stage T1c N0 M0) ? ?Procedure: ? ?Robotic assisted laparoscopic radical prostatectomy (left nerve sparing) ?Bilateral robotic assisted laparoscopic pelvic lymphadenectomy ? ?Surgeon: Roxy Horseman, Brooke Bonito. M.D. ? ?Assistant(s): Debbrah Alar, PA-C ? ?An assistant was required for this surgical procedure.  The duties of the assistant included but were not limited to suctioning, passing suture, camera manipulation, retraction. This procedure would not be able to be performed without an Environmental consultant.  ? ?Resident: Dr. Jimmy Footman ? ?Anesthesia: General ? ?Complications: None ? ?EBL: 150 mL ? ?IVF:  1800 mL crystalloid ? ?Specimens: ?Prostate and seminal vesicles ?Right pelvic lymph nodes ?Left pelvic lymph nodes ? ?Disposition of specimens: Pathology ? ?Drains: ?20 Fr coude catheter ?# 19 Blake pelvic drain ? ?Indication: Casey Hill is a 65 y.o. patient with clinically localized prostate cancer.  After a thorough review of the management options for treatment of prostate cancer, he elected to proceed with surgical therapy and the above procedure(s).  We have discussed the potential benefits and risks of the procedure, side effects of the proposed treatment, the likelihood of the patient achieving the goals of the procedure, and any potential problems that might occur during the procedure or recuperation. Informed consent has been obtained. ? ?Description of procedure: ? ?The patient was taken to the operating room and a general anesthetic was administered. He was given preoperative antibiotics, placed in the dorsal lithotomy position, and prepped and draped in the usual sterile fashion. Next a preoperative timeout was performed. A urethral catheter was placed into the bladder and a site was selected near the umbilicus for  placement of the camera port. This was placed using a standard open Hassan technique which allowed entry into the peritoneal cavity under direct vision and without difficulty. An 8 mm port was placed and a pneumoperitoneum established. The camera was then used to inspect the abdomen and there was no evidence of any intra-abdominal injuries or other abnormalities. The remaining abdominal ports were then placed. 8 mm robotic ports were placed in the right lower quadrant, left lower quadrant, and far left lateral abdominal wall. A 5 mm port was placed in the right upper quadrant and a 12 mm port was placed in the right lateral abdominal wall for laparoscopic assistance. All ports were placed under direct vision without difficulty. The surgical cart was then docked.  ? ?Utilizing the cautery scissors, the bladder was reflected posteriorly allowing entry into the space of Retzius and identification of the endopelvic fascia and prostate. The periprostatic fat was then removed from the prostate allowing full exposure of the endopelvic fascia. The endopelvic fascia was then incised from the apex back to the base of the prostate bilaterally and the underlying levator muscle fibers were swept laterally off the prostate thereby isolating the dorsal venous complex. The dorsal vein was then stapled and divided with a 45 mm Flex Echelon stapler. Attention then turned to the bladder neck which was divided anteriorly thereby allowing entry into the bladder and exposure of the urethral catheter. The catheter balloon was deflated and the catheter was brought into the operative field and used to retract the prostate anteriorly. The posterior bladder neck was then examined and was divided allowing further dissection between the bladder and prostate posteriorly until the vasa deferentia and seminal vessels were identified. The vasa deferentia were isolated, divided, and lifted anteriorly. The seminal vesicles were  dissected down to  their tips with care to control the seminal vascular arterial blood supply. These structures were then lifted anteriorly and the space between Denonvillier?s fascia and the anterior rectum was developed with a combination of sharp and blunt dissection. This isolated the vascular pedicles of the prostate. ? ?The lateral prostatic fascia on the left side of the prostate was then sharply incised allowing release of the neurovascular bundle. The vascular pedicle of the prostate on the left side was then ligated with Weck clips between the prostate and neurovascular bundle and divided with sharp cold scissor dissection resulting in neurovascular bundle preservation. On the right side, a wide non nerve sparing dissection was performed with Weck clips used to ligate the vascular pedicle of the prostate. The neurovascular bundle on the left side was then separated off the apex of the prostate and urethra. ? ? ?The urethra was then sharply transected allowing the prostate specimen to be disarticulated. The pelvis was copiously irrigated and hemostasis was ensured. There was no evidence for rectal injury. ? ?Attention then turned to the right pelvic sidewall. The fibrofatty tissue between the external iliac vein, confluence of the iliac vessels, hypogastric artery, and Cooper's ligament was dissected free from the pelvic sidewall with care to preserve the obturator nerve. Weck clips were used for lymphostasis and hemostasis. An identical procedure was performed on the contralateral side and the lymphatic packets were removed for permanent pathologic analysis. ? ?Attention then turned to the urethral anastomosis. A 2-0 Vicryl slip knot was placed between Denonvillier?s fascia, the posterior bladder neck, and the posterior urethra to reapproximate these structures. A double-armed 3-0 Monocryl suture was then used to perform a 360? running tension-free anastomosis between the bladder neck and urethra. A new urethral catheter was  then placed into the bladder and irrigated. There were no blood clots within the bladder and the anastomosis appeared to be watertight. A #19 Blake drain was then brought through the left lateral 8 mm port site and positioned appropriately within the pelvis. It was secured to the skin with a nylon suture. The surgical cart was then undocked. The right lateral 12 mm port site was closed at the fascial level with a 0 Vicryl suture placed laparoscopically. All remaining ports were then removed under direct vision. The prostate specimen was removed intact within the Endopouch retrieval bag via the periumbilical camera port site. This fascial opening was closed with two running 0 PDS sutures. 0.25% Marcaine was then injected into all port sites and all incisions were reapproximated at the skin level with 4-0 Monocryl subcuticular sutures and Dermabond. The patient appeared to tolerate the procedure well and without complications. The patient was able to be extubated and transferred to the recovery unit in satisfactory condition. ? ? ?Pryor Curia MD  ?

## 2021-12-04 NOTE — Anesthesia Procedure Notes (Signed)
Procedure Name: Intubation ?Date/Time: 12/04/2021 7:24 AM ?Performed by: Cleda Daub, CRNA ?Pre-anesthesia Checklist: Patient identified, Emergency Drugs available, Suction available and Patient being monitored ?Patient Re-evaluated:Patient Re-evaluated prior to induction ?Oxygen Delivery Method: Circle system utilized ?Preoxygenation: Pre-oxygenation with 100% oxygen ?Induction Type: IV induction ?Ventilation: Mask ventilation without difficulty ?Laryngoscope Size: Mac and 4 ?Grade View: Grade I ?Tube type: Oral ?Tube size: 7.5 mm ?Number of attempts: 1 ?Airway Equipment and Method: Stylet and Oral airway ?Placement Confirmation: ETT inserted through vocal cords under direct vision, positive ETCO2 and breath sounds checked- equal and bilateral ?Secured at: 22 cm ?Tube secured with: Tape ?Dental Injury: Teeth and Oropharynx as per pre-operative assessment  ? ? ? ? ?

## 2021-12-04 NOTE — H&P (Signed)
? ? ?Office Visit Report     11/14/2021  ? ?-------------------------------------------------------------------------------- ?  ?Casey Hill  ?MRN: [id number]  ?DOB: September 30, 1955, 66 year old Male  ?SSN:   ? PRIMARY CARE:  R Casey Scott, MD  ?REFERRING:  R Casey Scott, MD  ?PROVIDER:  Raynelle Hill, M.D.  ?LOCATION:  [facility] [phone]  ?  ? ?-------------------------------------------------------------------------------- ?  ?CC/HPI: CC: Prostate Cancer  ? ?Physician requesting consult: Dr. Burman Hill  ?PCP: Dr. Marcellus Hill  ? ?Casey Hill is a 66 year old gentleman who was initially diagnosed with favorable intermediate risk prostate cancer in August 2022. His PSA was 5.0 and he underwent a biopsy indicating 3 out of 12 biopsy cores positive with 2 of the 3 positive cores harboring low volume Gleason 3+4=7 adenocarcinoma noted. He elected initial active surveillance management. He underwent an MRI on 10/05/21 and this demonstrated two separate PI-RADS 4 lesions with ROI-1 at the right mid gland measured at 8 mm and ROI-2 as a focal lesion at the right mid lateral gland. His PSA had increased to 5.8. He underwent an MR/US fusion biopsy on 10/16/21. This demonstrated upgraded Gleason 4+4=8 adenocarcinoma noted in his targeted biopsies now demonstrating high risk disease. Overall, 5 out of 6 targeted biopsies were positive and 2 out of 12 systematic biopsies for a total of 7 out of 18 cores positive. Attempts were made to obtain a PSMA PET scan for staging purposes but this was denied by his insurance company despite request for appeal.  ? ?Family history: Father died of metastatic prostate cancer at age 80.  ? ?Imaging studies:  ?MRI (10/05/21) - No EPE, SVI, LAD, or bone lesions.  ? ?PMH: He has a history of hypertension and hyperlipidemia.  ?PSH: Laparoscopic cholecystectomy.  ? ?TNM stage: cT1c N0 M0  ?PSA: 5.8  ?Gleason score: 4+4=8 (GG 4)  ?Biopsy (10/16/21): 7/18 cores positive  ?Left:  L lateral mid (10%, 3+4=7)  ?Right: R lateral apex (80%, 4+3=7)  ?MR targets: 5/6 cores positive - (ROI-1: 3/3 cores, 60%, 50%, 50%, 4+3=7), (ROI-2: 2/3 cores, 70%, 20%, 4+4=8)  ?Prostate volume: 44.5 cc  ? ?Nomogram  ?OC disease: 46%  ?EPE: 52%  ?SVI: 8%  ?LNI: 11%  ?PFS (5 year, 10 year): 55%, 39%  ? ?Urinary function: IPSS is 4.  ?Erectile function: SHIM score is 2. He does have significant erectile dysfunction that has been only mildly responsive to PDE 5 inhibitors.  ? ?  ?ALLERGIES: No Allergies   ? ?MEDICATIONS: Sildenafil Citrate 20 mg tablet 2-5 tablets as needed for intimacy  ?Amlodipine Besylate 10 mg tablet  ?Dextroamphetamine Sulfate 10 mg tablet  ?Doxazosin Mesylate 8 mg tablet  ?Fish Oil 1,000 mg (120 mg-180 mg) capsule  ?Inspra 50 mg tablet  ?Omega 3  ?Potassium 600 mg (99 mg) tablet  ?Rosuvastatin Calcium 20 mg tablet  ?Spironolactone 25 mg tablet  ?Telmisartan-Hydrochlorothiazid 80 mg-25 mg tablet  ?Vitamin D3  ?Zolpidem Tartrate 10 mg tablet  ?  ? ?GU PSH: Prostate Needle Biopsy - 10/16/2021, 04/14/2021 ? ?  ? ?NON-GU PSH: Cataract surgery, 2022 ?Remove Gallbladder, 2000 ?Surgical Pathology, Gross And Microscopic Examination For Prostate Needle - 10/16/2021, 04/14/2021 ? ?  ? ?GU PMH: Prostate Cancer - 11/09/2021, - 10/23/2021, - 10/16/2021, - 04/20/2021 ?Elevated PSA - 04/14/2021, - 03/06/2021, - 12/05/2020 ?ED due to arterial insufficiency - 12/05/2020 ?  ? ?NON-GU PMH: Other lack of coordination - 11/09/2021 ?Hypercholesterolemia ?Hypertension ?  ? ?FAMILY HISTORY: 2 sons -  Son ?Heart Disease - Father ?Prostate Cancer - Father ?stroke - Mother  ? ?SOCIAL HISTORY: Marital Status: Married ?Preferred Language: [place]; Ethnicity: Not Hispanic Or Latino; Race: White ?Current Smoking Status: Patient has never smoked.  ? ?Tobacco Use Assessment Completed: Used Tobacco in last 30 days? ?Social Drinker.  ?Drinks 1 caffeinated drink per day. ?Patient's occupation Software engineer. ?  ? ?VITAL SIGNS:    ?   11/14/2021 08:18 AM  ?Weight 240 lb / 108.86 kg  ?Height 72 in / 182.88 cm  ?BP 133/70 mmHg  ?Pulse 96 /min  ?Temperature 98.0 F / 36.6 C  ?BMI 32.5 kg/m?  ? ?GU PHYSICAL EXAMINATION:    ?Prostate: Prostate about 30 grams. Left lobe normal consistency, right lobe normal consistency. Symmetrical lobes. No prostate nodule. Left lobe no tenderness, right lobe no tenderness.   ? ?MULTI-SYSTEM PHYSICAL EXAMINATION:    ?Constitutional: Well-nourished. No physical deformities. Normally developed. Good grooming.  ?Respiratory: No labored breathing, no use of accessory muscles. Clear bilaterally.  ?Cardiovascular: Normal temperature, normal extremity pulses, no swelling, no varicosities. Regular rate and rhythm.  ? ?  ?Complexity of Data:  ?Lab Test Review:   PSA  ?Records Review:   Pathology Reports, Previous Patient Records  ?X-Ray Review: MRI Prostate GSORAD: Reviewed Films.  ?  ? 10/02/21 12/05/20  ?PSA  ?Total PSA 5.8 ng/ml 5.0 ng/ml  ? ? ?PROCEDURES:    ? ?     Urinalysis - 81003 ?Dipstick Dipstick Cont'd  ?Color: Yellow Bilirubin: Neg mg/dL  ?Appearance: Clear Ketones: Neg mg/dL  ?Specific Gravity: 1.025 Blood: Neg ery/uL  ?pH: <=5.0 Protein: Trace mg/dL  ?Glucose: Neg mg/dL Urobilinogen: 0.2 mg/dL  ?  Nitrites: Neg  ?  Leukocyte Esterase: Neg leu/uL  ? ? ?ASSESSMENT:  ?    ICD-10 Details  ?1 GU:   Prostate Cancer - C61   ? ?PLAN:    ? ?      Schedule ?Return Visit/Planned Activity: Keep Scheduled Appointment  ? ? ?      Document ?Letter(s):  Created for Patient: Clinical Summary  ? Created for Chart: Follow Up Letter  ? ? ?     Notes:   1. High risk prostate cancer: I had a detailed discussion with Mr. Casey Hill and his wife participated via telephone as well. We reviewed his situation in detail and he was recommended to undergo therapy of curative intent considering his high risk disease and his life expectancy. The patient was counseled about the natural history of prostate cancer and the standard treatment options  that are available for prostate cancer. It was explained to him how his age and life expectancy, clinical stage, Gleason score/prognostic grade group, and PSA (and PSA density) affect his prognosis, the decision to proceed with additional staging studies, as well as how that information influences recommended treatment strategies. We discussed the roles for active surveillance, radiation therapy, surgical therapy, androgen deprivation, as well as ablative therapy and other investigational options for the treatment of prostate cancer as appropriate to his individual cancer situation. We discussed the risks and benefits of these options with regard to their impact on cancer control and also in terms of potential adverse events, complications, and impact on quality of life particularly related to urinary and sexual function. The patient was encouraged to ask questions throughout the discussion today and all questions were answered to his stated satisfaction. In addition, the patient was provided with and/or directed to appropriate resources and literature for further education about prostate  cancer and treatment options. We discussed surgical therapy for prostate cancer including the different available surgical approaches. We discussed, in detail, the risks and expectations of surgery with regard to cancer control, urinary control, and erectile function as well as the expected postoperative recovery process. Additional risks of surgery including but not limited to bleeding, infection, hernia formation, nerve damage, lymphocele formation, bowel/rectal injury potentially necessitating colostomy, damage to the urinary tract resulting in urine leakage, urethral stricture, and the cardiopulmonary risks such as myocardial infarction, stroke, death, venothromboembolism, etc. were explained. The risk of open surgical conversion for robotic/laparoscopic prostatectomy was also discussed.  ? ?He does confirm his decision that he  wishes to proceed with primary surgical management. He understands the potential need for multimodality therapy considering the high risk nature of his prostate cancer. He does have staging imaging stu

## 2021-12-05 ENCOUNTER — Encounter (HOSPITAL_COMMUNITY): Payer: Self-pay | Admitting: Urology

## 2021-12-05 DIAGNOSIS — Z79899 Other long term (current) drug therapy: Secondary | ICD-10-CM | POA: Diagnosis not present

## 2021-12-05 DIAGNOSIS — I10 Essential (primary) hypertension: Secondary | ICD-10-CM | POA: Diagnosis not present

## 2021-12-05 DIAGNOSIS — C61 Malignant neoplasm of prostate: Secondary | ICD-10-CM | POA: Diagnosis not present

## 2021-12-05 LAB — BASIC METABOLIC PANEL
Anion gap: 8 (ref 5–15)
BUN: 27 mg/dL — ABNORMAL HIGH (ref 8–23)
CO2: 25 mmol/L (ref 22–32)
Calcium: 8.3 mg/dL — ABNORMAL LOW (ref 8.9–10.3)
Chloride: 103 mmol/L (ref 98–111)
Creatinine, Ser: 1.13 mg/dL (ref 0.61–1.24)
GFR, Estimated: >60 mL/min (ref >60)
Glucose, Bld: 142 mg/dL — ABNORMAL HIGH (ref 70–99)
Potassium: 4 mmol/L (ref 3.5–5.1)
Sodium: 136 mmol/L (ref 135–145)

## 2021-12-05 LAB — HEMOGLOBIN AND HEMATOCRIT, BLOOD
HCT: 36.3 % — ABNORMAL LOW (ref 39.0–52.0)
Hemoglobin: 12.6 g/dL — ABNORMAL LOW (ref 13.0–17.0)

## 2021-12-05 MED ORDER — TRAMADOL HCL 50 MG PO TABS: 50.0000 mg | ORAL_TABLET | Freq: Four times a day (QID) | ORAL | Status: DC | PRN

## 2021-12-05 MED ORDER — CHLORHEXIDINE GLUCONATE CLOTH 2 % EX PADS
6.0000 | MEDICATED_PAD | Freq: Every day | CUTANEOUS | Status: DC
  Administered 2021-12-05: 6 via TOPICAL

## 2021-12-05 MED ORDER — BISACODYL 10 MG RE SUPP
10.0000 mg | Freq: Once | RECTAL | Status: AC
Start: 2021-12-05 — End: 2021-12-05
  Administered 2021-12-05: 10 mg via RECTAL
  Filled 2021-12-05: qty 1

## 2021-12-05 NOTE — Plan of Care (Signed)
?  Problem: Education: ?Goal: Knowledge of General Education information will improve ?Description: Including pain rating scale, medication(s)/side effects and non-pharmacologic comfort measures ?Outcome: Adequate for Discharge ?  ?Problem: Health Behavior/Discharge Planning: ?Goal: Ability to manage health-related needs will improve ?Outcome: Adequate for Discharge ?  ?Problem: Clinical Measurements: ?Goal: Ability to maintain clinical measurements within normal limits will improve ?Outcome: Adequate for Discharge ?Goal: Will remain free from infection ?Outcome: Adequate for Discharge ?Goal: Diagnostic test results will improve ?Outcome: Adequate for Discharge ?Goal: Respiratory complications will improve ?Outcome: Adequate for Discharge ?Goal: Cardiovascular complication will be avoided ?Outcome: Adequate for Discharge ?  ?Problem: Activity: ?Goal: Risk for activity intolerance will decrease ?Outcome: Adequate for Discharge ?  ?Problem: Nutrition: ?Goal: Adequate nutrition will be maintained ?Outcome: Adequate for Discharge ?  ?Problem: Coping: ?Goal: Level of anxiety will decrease ?Outcome: Adequate for Discharge ?  ?Problem: Elimination: ?Goal: Will not experience complications related to bowel motility ?Outcome: Adequate for Discharge ?Goal: Will not experience complications related to urinary retention ?Outcome: Adequate for Discharge ?  ?Problem: Pain Managment: ?Goal: General experience of comfort will improve ?Outcome: Adequate for Discharge ?  ?Problem: Safety: ?Goal: Ability to remain free from injury will improve ?Outcome: Adequate for Discharge ?  ?Problem: Skin Integrity: ?Goal: Risk for impaired skin integrity will decrease ?Outcome: Adequate for Discharge ?  ?Problem: Education: ?Goal: Knowledge of the procedure and recovery process will improve ?Outcome: Adequate for Discharge ?  ?Problem: Bowel/Gastric: ?Goal: Gastrointestinal status for postoperative course will improve ?Outcome: Adequate for  Discharge ?  ?Problem: Pain Management: ?Goal: General experience of comfort will improve ?Outcome: Adequate for Discharge ?  ?Problem: Skin Integrity: ?Goal: Demonstration of wound healing without infection will improve ?Outcome: Adequate for Discharge ?  ?Problem: Urinary Elimination: ?Goal: Ability to avoid or minimize complications of infection will improve ?Outcome: Adequate for Discharge ?Goal: Ability to achieve and maintain urine output will improve ?Outcome: Adequate for Discharge ?Goal: Home care management will improve ?Outcome: Adequate for Discharge ?  ?

## 2021-12-05 NOTE — Progress Notes (Signed)
Patient ID: Casey Hill, male   DOB: [birth year], 66 y.o.   MRN: [id number] ? ?1 Day Post-Op ?Subjective: ?The patient is doing well.  No nausea or vomiting. Pain is adequately controlled. ? ?Objective: ?Vital signs in last 24 hours: ?Temp:  [96.1 ?F (35.6 ?C)-99.9 ?F (37.7 ?C)] 98.8 ?F (37.1 ?C) (04/18 9629) ?Pulse Rate:  [50-86] 65 (04/18 0528) ?Resp:  [10-23] 23 (04/18 0528) ?BP: (92-138)/(51-83) 121/83 (04/18 5284) ?SpO2:  [90 %-100 %] 92 % (04/18 0528) ?Weight:  [112.1 kg] 112.1 kg (04/18 0049) ? ?Intake/Output from previous day: ?04/17 0701 - 04/18 0700 ?In: 4973.2 [P.O.:480; I.V.:4393.2; IV Piggyback:100] ?Out: 1520 [Urine:1050; Drains:320; Blood:150] ?Intake/Output this shift: ?Total I/O ?In: 74.9 [I.V.:74.9] ?Out: -  ? ?Physical Exam:  ?General: Alert and oriented. ?CV: RRR ?Lungs: Clear bilaterally. ?GI: Soft, Nondistended. ?Incisions: Clean, dry, and intact ?Urine: Clear ?Extremities: Nontender, no erythema, no edema. ? ?Lab Results: ?Recent Labs  ?  12/04/21 ?1202 12/05/21 ?0322  ?HGB 13.9 12.6*  ?HCT 39.7 36.3*  ? ? ?  ?Assessment/Plan: ?POD# 1 s/p robotic prostatectomy. ? ?1) SL IVF ?2) Ambulate, Incentive spirometry ?3) Transition to oral pain medication ?4) Dulcolax suppository ?5) D/C pelvic drain ?6) Plan for likely discharge later today ? ? ?Pryor Curia MD ? ? LOS: 0 days  ? ?Les Alinda Money ?12/05/2021, 7:41 AM ? ?  ?

## 2021-12-05 NOTE — Discharge Summary (Signed)
Date of admission: 12/04/2021 ? ?Date of discharge: 12/05/2021 ? ?Admission diagnosis: Prostate cancer ? ?Discharge diagnosis: Prostate cancer ? ?Secondary diagnoses:  ?Patient Active Problem List  ? Diagnosis Date Noted  ? Prostate cancer ([place]) 12/04/2021  ? Essential hypertension 11/13/2019  ? Pure hypercholesterolemia 11/13/2019  ? ADD (attention deficit disorder) 11/13/2019  ? ? ?Procedures performed: ?Procedure(s): ?XI ROBOTIC ASSISTED LAPAROSCOPIC RADICAL PROSTATECTOMY LEVEL 2 ?LYMPHADENECTOMY, PELVIC ? ?History and Physical: For full details, please see admission history and physical. Briefly, Casey Hill is a 66 y.o. year old patient with Hx of clinically localized prostate cancer. After a thorough review of the management options for treatment of prostate cancer, he elected to proceed with surgical therapy and the above procedure(s).   ? ?Hospital Course: Patient tolerated the procedure well.  He was then transferred to the floor after an uneventful PACU stay.  His hospital course was uncomplicated.  On POD#1 he had met discharge criteria: was eating a regular diet, was up and ambulating independently,  pain was well controlled, was tolerating his catheter well, and was ready to for discharge. ? ? ?Laboratory values:  ?Recent Labs  ?  12/04/21 ?1202 12/05/21 ?0322  ?HGB 13.9 12.6*  ?HCT 39.7 36.3*  ? ?Recent Labs  ?  12/05/21 ?0322  ?NA 136  ?K 4.0  ?CL 103  ?CO2 25  ?GLUCOSE 142*  ?BUN 27*  ?CREATININE 1.13  ?CALCIUM 8.3*  ? ?No results for input(s): LABPT, INR in the last 72 hours. ?No results for input(s): LABURIN in the last 72 hours. ?No results found for this or any previous visit. ? ?Disposition: Home ? ?Discharge instruction: The patient was instructed to be ambulatory but told to refrain from heavy lifting, strenuous activity, or driving.  ? ?Discharge medications:  ?Allergies as of 12/05/2021   ? ?   Reactions  ? Aldactone [spironolactone]   ? Nipple tenderness   ? ?  ? ?  ?Medication List  ?   ? ?STOP taking these medications   ? ?cholecalciferol 25 MCG (1000 UNIT) tablet ?Commonly known as: VITAMIN D3 ?  ?Fish Oil 1000 MG Caps ?  ?multivitamin with minerals tablet ?  ? ?  ? ?TAKE these medications   ? ?amLODipine 10 MG tablet ?Commonly known as: NORVASC ?Take 1 tablet (10 mg total) by mouth daily. ?  ?cloNIDine 0.2 mg/24hr patch ?Commonly known as: CATAPRES - Dosed in mg/24 hr ?APPLY 1 PATCH TO SKIN ONCE A WEEK ?  ?dextroamphetamine 5 MG tablet ?Commonly known as: DEXTROSTAT ?Take 10 mg by mouth See admin instructions. Take twice daily when working ?  ?docusate sodium 100 MG capsule ?Commonly known as: COLACE ?Take 1 capsule (100 mg total) by mouth 2 (two) times daily. ?  ?doxazosin 8 MG tablet ?Commonly known as: CARDURA ?Take 1 tablet (8 mg total) by mouth at bedtime. ?  ?eplerenone 50 MG tablet ?Commonly known as: INSPRA ?TAKE 1 TABLET(50 MG) BY MOUTH DAILY ?  ?Potassium 99 MG Tabs ?Take 99 mg by mouth at bedtime. ?  ?rosuvastatin 20 MG tablet ?Commonly known as: CRESTOR ?Take 20 mg by mouth daily. ?  ?sulfamethoxazole-trimethoprim 800-160 MG tablet ?Commonly known as: BACTRIM DS ?Take 1 tablet by mouth 2 (two) times daily. Start the day prior to foley removal appointment ?  ?telmisartan-hydrochlorothiazide 80-25 MG tablet ?Commonly known as: MICARDIS HCT ?Take 1 tablet by mouth daily. ?  ?traMADol 50 MG tablet ?Commonly known as: Ultram ?Take 1-2 tablets (50-100 mg total) by mouth every 6 (  six) hours as needed for moderate pain or severe pain. ?  ?zolpidem 10 MG tablet ?Commonly known as: AMBIEN ?Take 5 mg by mouth at bedtime as needed for sleep. ?  ? ?  ? ? ?Followup:  ? Follow-up Information   ? ? Raynelle Bring, MD Follow up on 12/12/2021.   ?Specialty: Urology ?Why: at 11:00 ?Contact information: ?[address] ?[place] [address] ?[phone] ? ? ?  ?  ? ?  ?  ? ?  ? ? ? ?

## 2021-12-05 NOTE — Plan of Care (Signed)
?  Problem: Education: ?Goal: Knowledge of General Education information will improve ?Description: Including pain rating scale, medication(s)/side effects and non-pharmacologic comfort measures ?Outcome: Progressing ?  ?Problem: Activity: ?Goal: Risk for activity intolerance will decrease ?Outcome: Progressing ?  ?Problem: Nutrition: ?Goal: Adequate nutrition will be maintained ?Outcome: Progressing ?  ?Problem: Elimination: ?Goal: Will not experience complications related to bowel motility ?Outcome: Progressing ?Goal: Will not experience complications related to urinary retention ?Outcome: Progressing ?  ?Problem: Pain Managment: ?Goal: General experience of comfort will improve ?Outcome: Progressing ?  ?Problem: Bowel/Gastric: ?Goal: Gastrointestinal status for postoperative course will improve ?Outcome: Progressing ?  ?

## 2021-12-09 DIAGNOSIS — G4733 Obstructive sleep apnea (adult) (pediatric): Secondary | ICD-10-CM | POA: Diagnosis not present

## 2021-12-09 LAB — SURGICAL PATHOLOGY

## 2021-12-20 DIAGNOSIS — N430 Encysted hydrocele: Secondary | ICD-10-CM | POA: Diagnosis not present

## 2021-12-21 DIAGNOSIS — R278 Other lack of coordination: Secondary | ICD-10-CM | POA: Diagnosis not present

## 2021-12-21 DIAGNOSIS — N393 Stress incontinence (female) (male): Secondary | ICD-10-CM | POA: Diagnosis not present

## 2022-01-04 DIAGNOSIS — N393 Stress incontinence (female) (male): Secondary | ICD-10-CM | POA: Diagnosis not present

## 2022-01-04 DIAGNOSIS — R278 Other lack of coordination: Secondary | ICD-10-CM | POA: Diagnosis not present

## 2022-01-08 DIAGNOSIS — G4733 Obstructive sleep apnea (adult) (pediatric): Secondary | ICD-10-CM | POA: Diagnosis not present

## 2022-01-22 DIAGNOSIS — G4731 Primary central sleep apnea: Secondary | ICD-10-CM | POA: Diagnosis not present

## 2022-01-30 DIAGNOSIS — N393 Stress incontinence (female) (male): Secondary | ICD-10-CM | POA: Diagnosis not present

## 2022-01-30 DIAGNOSIS — M6281 Muscle weakness (generalized): Secondary | ICD-10-CM | POA: Diagnosis not present

## 2022-01-30 DIAGNOSIS — M62838 Other muscle spasm: Secondary | ICD-10-CM | POA: Diagnosis not present

## 2022-02-08 DIAGNOSIS — G4733 Obstructive sleep apnea (adult) (pediatric): Secondary | ICD-10-CM | POA: Diagnosis not present

## 2022-03-09 ENCOUNTER — Encounter (HOSPITAL_BASED_OUTPATIENT_CLINIC_OR_DEPARTMENT_OTHER): Payer: Self-pay | Admitting: Cardiovascular Disease

## 2022-03-12 ENCOUNTER — Other Ambulatory Visit: Payer: Self-pay | Admitting: Pharmacist Clinician (PhC)/ Clinical Pharmacy Specialist

## 2022-03-12 MED ORDER — OLMESARTAN MEDOXOMIL-HCTZ 40-25 MG PO TABS: 1.0000 | ORAL_TABLET | Freq: Every day | ORAL | 3 refills | Status: DC

## 2022-03-12 NOTE — Telephone Encounter (Signed)
Please advise 

## 2022-03-12 NOTE — Telephone Encounter (Signed)
Pt. Returning your message! Thanks for your help!

## 2022-03-12 NOTE — Telephone Encounter (Signed)
Telmisartan-hctz no longer covered on insurance

## 2022-03-15 DIAGNOSIS — M62838 Other muscle spasm: Secondary | ICD-10-CM | POA: Diagnosis not present

## 2022-03-15 DIAGNOSIS — N393 Stress incontinence (female) (male): Secondary | ICD-10-CM | POA: Diagnosis not present

## 2022-03-15 DIAGNOSIS — M6281 Muscle weakness (generalized): Secondary | ICD-10-CM | POA: Diagnosis not present

## 2022-03-16 DIAGNOSIS — N393 Stress incontinence (female) (male): Secondary | ICD-10-CM | POA: Diagnosis not present

## 2022-03-16 DIAGNOSIS — C61 Malignant neoplasm of prostate: Secondary | ICD-10-CM | POA: Diagnosis not present

## 2022-03-16 DIAGNOSIS — N5201 Erectile dysfunction due to arterial insufficiency: Secondary | ICD-10-CM | POA: Diagnosis not present

## 2022-03-19 DIAGNOSIS — G4700 Insomnia, unspecified: Secondary | ICD-10-CM | POA: Diagnosis not present

## 2022-03-19 DIAGNOSIS — R7303 Prediabetes: Secondary | ICD-10-CM | POA: Diagnosis not present

## 2022-03-19 DIAGNOSIS — F909 Attention-deficit hyperactivity disorder, unspecified type: Secondary | ICD-10-CM | POA: Diagnosis not present

## 2022-03-19 DIAGNOSIS — I10 Essential (primary) hypertension: Secondary | ICD-10-CM | POA: Diagnosis not present

## 2022-03-20 DIAGNOSIS — G4733 Obstructive sleep apnea (adult) (pediatric): Secondary | ICD-10-CM | POA: Diagnosis not present

## 2022-03-22 ENCOUNTER — Telehealth: Payer: Self-pay

## 2022-03-22 DIAGNOSIS — Z0000 Encounter for general adult medical examination without abnormal findings: Secondary | ICD-10-CM

## 2022-03-22 NOTE — Telephone Encounter (Signed)
Patient returned call stating that he still had that cuff but thought that he was allowed to keep the device. Patient stated that he can return the cuff to the [facility] office but does not have the box that it originally came in. Patient was informed that it would be okay to return without the box.    Merian Wroe Truman Hayward, [facility] Guide, Health Coach [address], [address] [place] [address] Telephone: [phone] Email: [email or web address]

## 2022-03-22 NOTE — Telephone Encounter (Signed)
Called patient to determine if he had returned the Vivify cuff at the completion of the program or if he still has access to the cuff and need to return it. Left patient a message to return call to provide a status update on the cuff.   Anjalina Bergevin Truman Hayward, [facility] Guide, Health Coach [address], [address] [place] [address] Telephone: [phone] Email: [email or web address]

## 2022-04-11 ENCOUNTER — Ambulatory Visit (INDEPENDENT_AMBULATORY_CARE_PROVIDER_SITE_OTHER): Payer: BC Managed Care – PPO | Admitting: Cardiovascular Disease

## 2022-04-11 ENCOUNTER — Encounter (HOSPITAL_BASED_OUTPATIENT_CLINIC_OR_DEPARTMENT_OTHER): Payer: Self-pay | Admitting: Cardiovascular Disease

## 2022-04-11 DIAGNOSIS — E7800 Pure hypercholesterolemia, unspecified: Secondary | ICD-10-CM

## 2022-04-11 DIAGNOSIS — I10 Essential (primary) hypertension: Secondary | ICD-10-CM

## 2022-04-11 DIAGNOSIS — G4733 Obstructive sleep apnea (adult) (pediatric): Secondary | ICD-10-CM | POA: Diagnosis not present

## 2022-04-11 MED ORDER — AMLODIPINE BESYLATE 5 MG PO TABS: 5.0000 mg | ORAL_TABLET | Freq: Every day | ORAL | 3 refills | Status: DC

## 2022-04-11 MED ORDER — HYDRALAZINE HCL 50 MG PO TABS: 50.0000 mg | ORAL_TABLET | Freq: Two times a day (BID) | ORAL | 2 refills | Status: DC

## 2022-04-11 NOTE — Progress Notes (Signed)
[facility] Follow-up:   Date:  04/11/2022   ID:  Casey Hill, DOB [birth year], MRN [id number]  PCP:  Casey Huddle, MD  Cardiologist:  None  Electrophysiologist:  None   Evaluation Performed:  Follow-Up Visit  Chief Complaint:  hypertension   History of Present Illness:    Casey Hill is a 66 y.o. male with a hx of hypertension and hyperlipidemia here for follow-up.  Casey Hill was first diagnosed with hypertension approximately 15 years ago.  His blood pressure was previously well-controlled.  However in 7829 Bystolic was discontinued.  He was started on metoprolol and his blood pressure was not well have been controlled.  Therefore his PCP switched it to bisoprolol and it still remained elevated. He was diagnosed with ADD as an adult.  He has been on multiple different stimulant agents.  Most recently he had been taking dextroamphetamine.  He does not take it on the weekends.  Casey. [name] reduced the dose to 5 mg daily from 10 mg. He noted a difference in his ability to get through his day successfully since reducing the dose.  There wasn't a significant change in his blood pressure on 5 mg versus 10 mg.  He did note that his blood pressure was better controlled on the weekends when he did not take it at all.    At his first appointment clonidine was switched to a patch.  Casey Hill was seen by our [facility] pharmacists three times.  He was started on nebivolol and amlodipine was increased.  Bystolic was reduced to '10mg'$  due to bradycardia.  He noted that the Bystolic did not seem to really affect his blood pressure very much.  Spironolactone was added and then increased to '50mg'$ .  Clonidine was also switched from tablets to a patch.  He was not tested for hyperaldosteronism.  However given that there is no change in his blood pressure after adding spironolactone, this was thought to be unlikely to be the on the underlying cause.  Spironolactone was increased to 50 mg on  6/29.  He has been enrolled in the remote patient monitoring system.  Overall he had seen an improvement in his blood pressures.  His blood pressure remained poorly controlled and nebivolol was costly.  This was discontinued and he was started on doxazosin.  His average blood pressure of on remote patient monitoring has been 129/81.  He completed the exercise program at the [facility].   At his last appointment he was struggling with family stressors. He had a BP log showing well controlled readings at home. He was encouraged to keep tracking his blood pressure. On 03/09/2022 his telmisartan-HCTZ 80-25 mg was switched to olmesartan-HCTZ 40-25 mg due to insurance coverage changes. He underwent prostatectomy on 11/2021.  Today, he reports that his surgery went well. This morning his blood pressure at home was 125/69 with a heart rate of 78 bpm. He notes that his blood pressure was a little lower during his surgery. Lately he has noticed bilateral ankle swelling. In the evenings he will elevate his feet. By the next morning his swelling will improve. Typically he takes amlodipine and rosuvastatin in the evenings as he goes to bed; he takes the rest of his medications in the morning. He has also been diagnosed with sleep apnea, but is struggling to tolerate his CPAP. He continues to try wearing it every night and has tried different masks and equipment. He has not felt any differently when waking up in  the mornings. However, his wife has noticed decreased breathing cessation. He admits to not exercising very frequently as his work has been keeping him busy. He denies any palpitations, chest pain, shortness of breath. No lightheadedness, headaches, syncope, or PND.  Previous antihypertensives: Metoprolol Nebivolol  Past Medical History:  Diagnosis Date   ADD (attention deficit disorder) 11/13/2019   Essential hypertension 11/13/2019   GERD (gastroesophageal reflux disease)    OSA (obstructive sleep apnea)  04/11/2022   Pneumonia    Pure hypercholesterolemia 11/13/2019   Sleep apnea    cpap    Past Surgical History:  Procedure Laterality Date   bilateral cataract surgery      CHOLECYSTECTOMY     LYMPHADENECTOMY Bilateral 12/04/2021   Procedure: LYMPHADENECTOMY, PELVIC;  Surgeon: Raynelle Bring, MD;  Location: [facility];  Service: Urology;  Laterality: Bilateral;   ROBOT ASSISTED LAPAROSCOPIC RADICAL PROSTATECTOMY N/A 12/04/2021   Procedure: XI ROBOTIC ASSISTED LAPAROSCOPIC RADICAL PROSTATECTOMY LEVEL 2;  Surgeon: Raynelle Bring, MD;  Location: [facility];  Service: Urology;  Laterality: N/A;    Current Medications: Current Meds  Medication Sig   cloNIDine (CATAPRES - DOSED IN MG/24 HR) 0.2 mg/24hr patch APPLY 1 PATCH TO SKIN ONCE A WEEK   dextroamphetamine (DEXTROSTAT) 5 MG tablet Take 10 mg by mouth See admin instructions. Take twice daily when working   docusate sodium (COLACE) 100 MG capsule Take 1 capsule (100 mg total) by mouth 2 (two) times daily.   doxazosin (CARDURA) 8 MG tablet Take 1 tablet (8 mg total) by mouth at bedtime.   eplerenone (INSPRA) 50 MG tablet TAKE 1 TABLET(50 MG) BY MOUTH DAILY   hydrALAZINE (APRESOLINE) 50 MG tablet Take 1 tablet (50 mg total) by mouth in the morning and at bedtime.   olmesartan-hydrochlorothiazide (BENICAR HCT) 40-25 MG tablet Take 1 tablet by mouth daily.   Potassium 99 MG TABS Take 99 mg by mouth at bedtime.   rosuvastatin (CRESTOR) 20 MG tablet Take 20 mg by mouth daily.   zolpidem (AMBIEN) 10 MG tablet Take 5 mg by mouth at bedtime as needed for sleep.   [DISCONTINUED] amLODipine (NORVASC) 10 MG tablet Take 1 tablet (10 mg total) by mouth daily.     Allergies:   Aldactone [spironolactone]   Social History   Socioeconomic History   Marital status: Married    Spouse name: Not on file   Number of children: Not on file   Years of education: Not on file   Highest education level: Not on file  Occupational History   Not on file  Tobacco Use    Smoking status: Never   Smokeless tobacco: Never  Vaping Use   Vaping Use: Never used  Substance and Sexual Activity   Alcohol use: Yes    Alcohol/week: 7.0 standard drinks of alcohol    Types: 7 Glasses of wine per week    Comment: glass of wine nightly   Drug use: Never   Sexual activity: Not on file  Other Topics Concern   Not on file  Social History Narrative   Not on file   Social Determinants of Health   Financial Resource Strain: Not on file  Food Insecurity: Not on file  Transportation Needs: Not on file  Physical Activity: Not on file  Stress: Not on file  Social Connections: Not on file     Family History: The patient's family history includes Glaucoma in his father and mother; Healthy in his brother; Heart attack in his father; Hyperlipidemia in his  father; Hypertension in his father; Prostate cancer in his father; Stroke in his mother.  ROS:   Please see the history of present illness.    (+) Bilateral ankle edema All other systems reviewed and are negative.  EKGs/Labs/Other Studies Reviewed:    Renal Artery Duplex 11/25/2019: Summary:  Renal:     Right: No evidence of right renal artery stenosis. RRV flow present.         Cyst(s) noted. Normal size right kidney. Normal right         Resisitive Index. Normal cortical thickness of right kidney.  Left:  No evidence of left renal artery stenosis. LRV flow present.         Normal size of left kidney. Normal left Resistive Index.         Normal cortical thickness of the left kidney.  Mesenteric:  Normal Celiac artery and Superior Mesenteric artery findings.   EKG:  EKG is personally reviewed. 04/11/2022:  Sinus rhythm. Rate 64 bpm. RBBB. LAD. 06/19/2021: EKG was not ordered. 06/06/2020: EKG was not ordered.    Recent Labs: 11/13/2021: Platelets 148 12/05/2021: BUN 27; Creatinine, Ser 1.13; Hemoglobin 12.6; Potassium 4.0; Sodium 136   Recent Lipid Panel No results found for: "CHOL", "TRIG", "HDL",  "CHOLHDL", "VLDL", "LDLCALC", "LDLDIRECT"  Physical Exam:    VS:  BP 124/84 (BP Location: Left Arm, Patient Position: Sitting, Cuff Size: Large)   Pulse 64   Ht '5\' 11"'$  (1.803 m)   Wt 248 lb 8 oz (112.7 kg)   BMI 34.66 kg/m  , BMI Body mass index is 34.66 kg/m. GENERAL:  Well appearing HEENT: Pupils equal round and reactive, fundi not visualized, oral mucosa unremarkable NECK:  No jugular venous distention, waveform within normal limits, carotid upstroke brisk and symmetric, no bruits, no thyromegaly LUNGS:  Clear to auscultation bilaterally HEART:  RRR.  PMI not displaced or sustained,S1 and S2 within normal limits, no S3, no S4, no clicks, no rubs, no murmurs ABD:  Flat, positive bowel sounds normal in frequency in pitch, no bruits, no rebound, no guarding, no midline pulsatile mass, no hepatomegaly, no splenomegaly EXT:  2 plus pulses throughout, 1+ non-pitting LE edema to the ankles, no cyanosis no clubbing SKIN:  No rashes no nodules NEURO:  Cranial nerves II through XII grossly intact, motor grossly intact throughout PSYCH:  Cognitively intact, oriented to person place and time   ASSESSMENT:    1. Essential hypertension   2. OSA (obstructive sleep apnea)   3. Pure hypercholesterolemia      PLAN:    Essential hypertension Blood pressure has been much more stable.  He is doing well but has LE edema.  We will try reducing amlodipine to '5mg'$ .  Add hydralazine '50mg'$  bid.  Continue olmesartan/HCTZ, eplerenone, doxazosin, and clonidine patch.  HR too low for nodal agents.  Keep trying to use CPAP.  OSA (obstructive sleep apnea) He has struggled with the fit on his CPAP but is using it every night.   Pure hypercholesterolemia Continue rosuvastatin.  Lipids are managed by his PCP.   Disposition:    FU with Casey Franchini C. Oval Linsey, MD, [facility] in 1 month with virtual visit.  Medication Adjustments/Labs and Tests Ordered: Current medicines are reviewed at length with the patient  today.  Concerns regarding medicines are outlined above.   Orders Placed This Encounter  Procedures   EKG 12-Lead   Meds ordered this encounter  Medications   amLODipine (NORVASC) 5 MG tablet    Sig: Take  1 tablet (5 mg total) by mouth daily.    Dispense:  90 tablet    Refill:  3   hydrALAZINE (APRESOLINE) 50 MG tablet    Sig: Take 1 tablet (50 mg total) by mouth in the morning and at bedtime.    Dispense:  180 tablet    Refill:  2   Patient Instructions  Medication Instructions:  DECREASE- Amlodipine 5 mg by mouth daily START- Hydralazine 50 mg by mouth twice a day  *If you need a refill on your cardiac medications before your next appointment, please call your pharmacy*  Lab Work: None Ordered  Testing/Procedures: None Ordered  Follow-Up: At [facility], you and your health needs are our priority.  As part of our continuing mission to provide you with exceptional heart care, we have created designated Provider Care Teams.  These Care Teams include your primary Cardiologist (physician) and Advanced Practice Providers (APPs -  Physician Assistants and Nurse Practitioners) who all work together to provide you with the care you need, when you need it.  We recommend signing up for the patient portal called "MyChart".  Sign up information is provided on this After Visit Summary.  MyChart is used to connect with patients for Virtual Visits (Telemedicine).  Patients are able to view lab/test results, encounter notes, upcoming appointments, etc.  Non-urgent messages can be sent to your provider as well.   To learn more about what you can do with MyChart, go to [email or web address].    Your next appointment:   9/272023 10:00 AM VIRTUAL VISIT   The format for your next appointment:   Virtual Visit   Provider:   Dr Skeet Hill  Other Instructions  Jane, LOG AND HAVE AVAILABLE       I,Casey Hill,acting as a scribe for Skeet Latch,  MD.,have documented all relevant documentation on the behalf of Skeet Latch, MD,as directed by  Skeet Latch, MD while in the presence of Skeet Latch, MD.  I, [place] Oval Linsey, MD have reviewed all documentation for this visit.  The documentation of the exam, diagnosis, procedures, and orders on 04/11/2022 are all accurate and complete.  Signed, Skeet Latch, MD  04/11/2022 5:00 PM    [place]

## 2022-04-11 NOTE — Assessment & Plan Note (Signed)
He has struggled with the fit on his CPAP but is using it every night.

## 2022-04-11 NOTE — Assessment & Plan Note (Signed)
Blood pressure has been much more stable.  He is doing well but has LE edema.  We will try reducing amlodipine to '5mg'$ .  Add hydralazine '50mg'$  bid.  Continue olmesartan/HCTZ, eplerenone, doxazosin, and clonidine patch.  HR too low for nodal agents.  Keep trying to use CPAP.

## 2022-04-11 NOTE — Patient Instructions (Addendum)
Medication Instructions:  DECREASE- Amlodipine 5 mg by mouth daily START- Hydralazine 50 mg by mouth twice a day  *If you need a refill on your cardiac medications before your next appointment, please call your pharmacy*  Lab Work: None Ordered  Testing/Procedures: None Ordered  Follow-Up: At [facility], you and your health needs are our priority.  As part of our continuing mission to provide you with exceptional heart care, we have created designated Provider Care Teams.  These Care Teams include your primary Cardiologist (physician) and Advanced Practice Providers (APPs -  Physician Assistants and Nurse Practitioners) who all work together to provide you with the care you need, when you need it.  We recommend signing up for the patient portal called "MyChart".  Sign up information is provided on this After Visit Summary.  MyChart is used to connect with patients for Virtual Visits (Telemedicine).  Patients are able to view lab/test results, encounter notes, upcoming appointments, etc.  Non-urgent messages can be sent to your provider as well.   To learn more about what you can do with MyChart, go to [email or web address].    Your next appointment:   9/272023 10:00 AM VIRTUAL VISIT   The format for your next appointment:   Virtual Visit   Provider:   Dr Skeet Latch  Other Instructions  [place], [place]

## 2022-04-11 NOTE — Assessment & Plan Note (Signed)
Continue rosuvastatin.  Lipids are managed by his PCP.

## 2022-04-16 ENCOUNTER — Other Ambulatory Visit (HOSPITAL_BASED_OUTPATIENT_CLINIC_OR_DEPARTMENT_OTHER): Payer: Self-pay | Admitting: Cardiovascular Disease

## 2022-04-16 NOTE — Telephone Encounter (Signed)
Rx request sent to pharmacy.  

## 2022-04-26 ENCOUNTER — Encounter (HOSPITAL_BASED_OUTPATIENT_CLINIC_OR_DEPARTMENT_OTHER): Payer: Self-pay

## 2022-05-16 ENCOUNTER — Telehealth (INDEPENDENT_AMBULATORY_CARE_PROVIDER_SITE_OTHER): Payer: BC Managed Care – PPO | Admitting: Cardiovascular Disease

## 2022-05-16 ENCOUNTER — Encounter (HOSPITAL_BASED_OUTPATIENT_CLINIC_OR_DEPARTMENT_OTHER): Payer: Self-pay | Admitting: Cardiovascular Disease

## 2022-05-16 DIAGNOSIS — E7800 Pure hypercholesterolemia, unspecified: Secondary | ICD-10-CM | POA: Diagnosis not present

## 2022-05-16 DIAGNOSIS — I10 Essential (primary) hypertension: Secondary | ICD-10-CM | POA: Diagnosis not present

## 2022-05-16 MED ORDER — HYDRALAZINE HCL 100 MG PO TABS
100.0000 mg | ORAL_TABLET | Freq: Three times a day (TID) | ORAL | 2 refills | Status: DC
Start: 2022-05-16 — End: 2022-08-30

## 2022-05-16 NOTE — Assessment & Plan Note (Signed)
Continue rosuvastatin.  

## 2022-05-16 NOTE — Progress Notes (Signed)
Virtual Visit via Video Note   Because of Casey Hill's co-morbid illnesses, he is at least at moderate risk for complications without adequate follow up.  This format is felt to be most appropriate for this patient at this time.  All issues noted in this document were discussed and addressed.  A limited physical exam was performed with this format.  Please refer to the patient's chart for his consent to telehealth for [facility].  The patient was identified using 2 identifiers.  Date:  05/16/2022   ID:  Casey Hill, DOB [birth year], MRN [id number]  Patient Location: Home Provider Location: Office/Clinic  PCP:  Josetta Huddle, MD  Cardiologist:  None  Electrophysiologist:  None   Evaluation Performed:  Follow-Up Visit  Chief Complaint:  HTN  History of Present Illness:    The patient does not have symptoms concerning for COVID-19 infection (fever, chills, cough, or new shortness of breath).   Casey Hill is a 66 y.o. male with a hx of hypertension and hyperlipidemia here for follow-up.  Mr. Kagel was first diagnosed with hypertension approximately 15 years ago.  His blood pressure was previously well-controlled.  However in 1062 Bystolic was discontinued.  He was started on metoprolol and his blood pressure was not well have been controlled.  Therefore his PCP switched it to bisoprolol and it still remained elevated. He was diagnosed with ADD as an adult.  He has been on multiple different stimulant agents.  Most recently he had been taking dextroamphetamine.  He does not take it on the weekends.  Dr. [name] reduced the dose to 5 mg daily from 10 mg. He noted a difference in his ability to get through his day successfully since reducing the dose.  There wasn't a significant change in his blood pressure on 5 mg versus 10 mg.  He did note that his blood pressure was better controlled on the weekends when he did not take it at all.    At his first appointment  clonidine was switched to a patch.  Mr. Wahlen was seen by our [facility] pharmacists three times.  He was started on nebivolol and amlodipine was increased.  Bystolic was reduced to '10mg'$  due to bradycardia.  He noted that the Bystolic did not seem to really affect his blood pressure very much.  Spironolactone was added and then increased to '50mg'$ .  Clonidine was also switched from tablets to a patch.  He was not tested for hyperaldosteronism.  However given that there is no change in his blood pressure after adding spironolactone, this was thought to be unlikely to be the on the underlying cause.  Spironolactone was increased to 50 mg on 6/29.  He has been enrolled in the remote patient monitoring system.  Overall he had seen an improvement in his blood pressures.  His blood pressure remained poorly controlled and nebivolol was costly.  This was discontinued and he was started on doxazosin.  His average blood pressure of on remote patient monitoring has been 129/81.  He completed the [facility] program at the [facility].   On 03/09/2022 his telmisartan-HCTZ 80-25 mg was switched to olmesartan-HCTZ 40-25 mg due to insurance coverage changes. He underwent prostatectomy on 11/2021. At the last visit his blood pressures were controlled but he noted new ankle edema. Amlodipine was reduced to 5 mg, and hydralazine was added.   Today, he reports that his blood pressure has increased since his recent medication changes. In the last 2 weeks his  at home readings have averaged 146/76 with a heart rate of 74 bpm. For 2 days in a row, he has noticed that his blood pressure in the morning was initially around 150/80. He then waited for 5 minutes before rechecking, and his BP improved to close to 135/77. He states that this has not been an issue previously. This morning his blood pressure was 149/72. He is compliant with hydralazine twice a day; usually between 6:30 - 7:00 AM, and between 10:30 - 11:00 PM when he goes to bed. Typically he  still feels asymptomatic with higher blood pressures. Since reducing amlodipine to 5 mg, he has noticed that his swelling improved a little but has not yet resolved. Additionally he continues to struggle with becoming accustomed to his CPAP since 09/2021, but he is compliant with using the CPAP. He denies any palpitations, chest pain, or shortness of breath. No lightheadedness, headaches, syncope, orthopnea, or PND.  Previous antihypertensives: Metoprolol Nebivolol  Past Medical History:  Diagnosis Date   ADD (attention deficit disorder) 11/13/2019   Essential hypertension 11/13/2019   GERD (gastroesophageal reflux disease)    OSA (obstructive sleep apnea) 04/11/2022   Pneumonia    Pure hypercholesterolemia 11/13/2019   Sleep apnea    cpap    Past Surgical History:  Procedure Laterality Date   bilateral cataract surgery      CHOLECYSTECTOMY     LYMPHADENECTOMY Bilateral 12/04/2021   Procedure: LYMPHADENECTOMY, PELVIC;  Surgeon: Raynelle Bring, MD;  Location: [facility];  Service: Urology;  Laterality: Bilateral;   ROBOT ASSISTED LAPAROSCOPIC RADICAL PROSTATECTOMY N/A 12/04/2021   Procedure: XI ROBOTIC ASSISTED LAPAROSCOPIC RADICAL PROSTATECTOMY LEVEL 2;  Surgeon: Raynelle Bring, MD;  Location: [facility];  Service: Urology;  Laterality: N/A;    Current Medications: Current Meds  Medication Sig   amLODipine (NORVASC) 5 MG tablet Take 1 tablet (5 mg total) by mouth daily.   cloNIDine (CATAPRES - DOSED IN MG/24 HR) 0.2 mg/24hr patch APPLY 1 PATCH TO SKIN ONCE A WEEK   dextroamphetamine (DEXTROSTAT) 5 MG tablet Take 10 mg by mouth See admin instructions. Take twice daily when working   docusate sodium (COLACE) 100 MG capsule Take 1 capsule (100 mg total) by mouth 2 (two) times daily.   doxazosin (CARDURA) 8 MG tablet Take 1 tablet (8 mg total) by mouth at bedtime.   eplerenone (INSPRA) 50 MG tablet TAKE 1 TABLET(50 MG) BY MOUTH DAILY   olmesartan-hydrochlorothiazide (BENICAR HCT) 40-25 MG tablet  Take 1 tablet by mouth daily.   Potassium 99 MG TABS Take 99 mg by mouth at bedtime.   rosuvastatin (CRESTOR) 20 MG tablet Take 20 mg by mouth daily.   zolpidem (AMBIEN) 10 MG tablet Take 5 mg by mouth at bedtime as needed for sleep.   [DISCONTINUED] hydrALAZINE (APRESOLINE) 50 MG tablet Take 1 tablet (50 mg total) by mouth in the morning and at bedtime.     Allergies:   Aldactone [spironolactone]   Social History   Socioeconomic History   Marital status: Married    Spouse name: Not on file   Number of children: Not on file   Years of education: Not on file   Highest education level: Not on file  Occupational History   Not on file  Tobacco Use   Smoking status: Never   Smokeless tobacco: Never  Vaping Use   Vaping Use: Never used  Substance and Sexual Activity   Alcohol use: Yes    Alcohol/week: 7.0 standard drinks of alcohol  Types: 7 Glasses of wine per week    Comment: glass of wine nightly   Drug use: Never   Sexual activity: Not on file  Other Topics Concern   Not on file  Social History Narrative   Not on file   Social Determinants of Health   Financial Resource Strain: Not on file  Food Insecurity: Not on file  Transportation Needs: Not on file  Physical Activity: Not on file  Stress: Not on file  Social Connections: Not on file     Family History: The patient's family history includes Glaucoma in his father and mother; Healthy in his brother; Heart attack in his father; Hyperlipidemia in his father; Hypertension in his father; Prostate cancer in his father; Stroke in his mother.  ROS:   Please see the history of present illness.    (+) LE edema All other systems reviewed and are negative.  EKGs/Labs/Other Studies Reviewed:    Renal Artery Duplex 11/25/2019: Summary:  Renal:     Right: No evidence of right renal artery stenosis. RRV flow present.         Cyst(s) noted. Normal size right kidney. Normal right         Resisitive Index. Normal cortical  thickness of right kidney.  Left:  No evidence of left renal artery stenosis. LRV flow present.         Normal size of left kidney. Normal left Resistive Index.         Normal cortical thickness of the left kidney.  Mesenteric:  Normal Celiac artery and Superior Mesenteric artery findings.   EKG:  EKG is personally reviewed. 05/16/2022:  EKG was not ordered. 04/11/2022:  Sinus rhythm. Rate 64 bpm. RBBB. LAD. 06/19/2021: EKG was not ordered. 06/06/2020: EKG was not ordered.    Recent Labs: 11/13/2021: Platelets 148 12/05/2021: BUN 27; Creatinine, Ser 1.13; Hemoglobin 12.6; Potassium 4.0; Sodium 136   Recent Lipid Panel No results found for: "CHOL", "TRIG", "HDL", "CHOLHDL", "VLDL", "LDLCALC", "LDLDIRECT"  Physical Exam:    BP (!) 149/72   Pulse 71   Ht '5\' 11"'$  (1.803 m)   Wt 245 lb (111.1 kg)   BMI 34.17 kg/m  GENERAL: Well-appearing.  No acute distress. HEENT: Pupils equal round.  Oral mucosa unremarkable NECK:  No jugular venous distention, no visible thyromegaly EXT:  No edema, no cyanosis no clubbing SKIN:  No rashes no nodules NEURO:  Speech fluent.  Cranial nerves grossly intact.  Moves all 4 extremities freely PSYCH:  Cognitively intact, oriented to person place and time   ASSESSMENT:    1. Essential hypertension   2. Pure hypercholesterolemia       PLAN:    Essential hypertension Blood pressure is uncontrolled now that amlodipine was reduced.  Swelling has improved.  We will increase hydralazine to '100mg'$  tid.  Continue amlodipine '5mg'$ , clonidine, doxazosin, eplerenone, olmesartan/HCTZ.  Pure hypercholesterolemia Continue rosuvastatin.   COVID-19 Education: The signs and symptoms of COVID-19 were discussed with the patient and how to seek care for testing (follow up with PCP or arrange E-visit).  The importance of social distancing was discussed today.  Time:   Today, I have spent  minutes with the patient with telehealth technology discussing the above  problems.    Disposition:    FU with Kabrea Seeney C. Oval Linsey, MD, [facility] in 1-2 months with virtual visit.  Medication Adjustments/Labs and Tests Ordered: Current medicines are reviewed at length with the patient today.  Concerns regarding medicines are outlined above.  No orders of the defined types were placed in this encounter.  Meds ordered this encounter  Medications   hydrALAZINE (APRESOLINE) 100 MG tablet    Sig: Take 1 tablet (100 mg total) by mouth 3 (three) times daily.    Dispense:  90 tablet    Refill:  2    NEW DOSE, D/C 50 MG RX   Patient Instructions  Medication Instructions:  INCREASE HYDRALAZINE TO 100 MG THREE TIMES A DAY   Labwork: NONE  Testing/Procedures: NONE  Follow-Up: 06/12/2022 10:00 AM VIRTUAL VISIT WITH DR Jennings      I,Mathew Stumpf,acting as a Education administrator for Skeet Latch, MD.,have documented all relevant documentation on the behalf of Skeet Latch, MD,as directed by  Skeet Latch, MD while in the presence of Skeet Latch, MD.  I, [place] Oval Linsey, MD have reviewed all documentation for this visit.  The documentation of the exam, diagnosis, procedures, and orders on 05/16/2022 are all accurate and complete.  Signed, Skeet Latch, MD  05/16/2022 10:53 AM    [place]

## 2022-05-16 NOTE — Patient Instructions (Signed)
Medication Instructions:  INCREASE HYDRALAZINE TO 100 MG THREE TIMES A DAY   Labwork: NONE  Testing/Procedures: NONE  Follow-Up: 06/12/2022 10:00 AM VIRTUAL VISIT WITH DR [facility]

## 2022-05-16 NOTE — Assessment & Plan Note (Signed)
Blood pressure is uncontrolled now that amlodipine was reduced.  Swelling has improved.  We will increase hydralazine to '100mg'$  tid.  Continue amlodipine '5mg'$ , clonidine, doxazosin, eplerenone, olmesartan/HCTZ.

## 2022-05-23 ENCOUNTER — Telehealth: Payer: Self-pay | Admitting: Genetic Counselor

## 2022-05-23 NOTE — Telephone Encounter (Signed)
Scheduled appt per 10/3 referral. Pt is aware of appt date and time. Pt is aware to arrive 15 mins prior to appt time and to bring and updated insurance card. Pt is aware of appt location.   

## 2022-06-06 ENCOUNTER — Other Ambulatory Visit (HOSPITAL_BASED_OUTPATIENT_CLINIC_OR_DEPARTMENT_OTHER): Payer: Self-pay | Admitting: Cardiovascular Disease

## 2022-06-07 ENCOUNTER — Encounter (HOSPITAL_BASED_OUTPATIENT_CLINIC_OR_DEPARTMENT_OTHER): Payer: Self-pay | Admitting: Cardiovascular Disease

## 2022-06-07 MED ORDER — AMLODIPINE BESYLATE 5 MG PO TABS: 5.0000 mg | ORAL_TABLET | Freq: Every day | ORAL | 3 refills | Status: DC

## 2022-06-07 NOTE — Telephone Encounter (Signed)
Rx request sent to pharmacy.  

## 2022-06-12 ENCOUNTER — Encounter (HOSPITAL_BASED_OUTPATIENT_CLINIC_OR_DEPARTMENT_OTHER): Payer: Self-pay | Admitting: Cardiovascular Disease

## 2022-06-12 ENCOUNTER — Telehealth (INDEPENDENT_AMBULATORY_CARE_PROVIDER_SITE_OTHER): Payer: BC Managed Care – PPO | Admitting: Cardiovascular Disease

## 2022-06-12 DIAGNOSIS — G4733 Obstructive sleep apnea (adult) (pediatric): Secondary | ICD-10-CM

## 2022-06-12 DIAGNOSIS — E7800 Pure hypercholesterolemia, unspecified: Secondary | ICD-10-CM

## 2022-06-12 DIAGNOSIS — I10 Essential (primary) hypertension: Secondary | ICD-10-CM

## 2022-06-12 NOTE — Assessment & Plan Note (Signed)
He has struggled with using his CPAP Machine.  He heard about the Inspire device and will follow-up with the sleep medicine team to see if he is a candidate.

## 2022-06-12 NOTE — Assessment & Plan Note (Signed)
BP is now much better controlled.  Still not quite at goal.  We will continue amlodipine, clonidine, doxazosin, eplerenone, hydralazine, olmesartan, and hydrochlorothiazide.  He will keep working on his regular exercise and limiting sodium intake.  We did discuss the possibility of renal denervation given his multiple medications.  He is going to think about this.

## 2022-06-12 NOTE — Patient Instructions (Signed)
Medication Instructions:  Your physician recommends that you continue on your current medications as directed. Please refer to the Current Medication list given to you today.   *If you need a refill on your cardiac medications before your next appointment, please call your pharmacy*  Lab Work: NONE  Testing/Procedures: NONE  Follow-Up: At [facility], you and your health needs are our priority.  As part of our continuing mission to provide you with exceptional heart care, we have created designated Provider Care Teams.  These Care Teams include your primary Cardiologist (physician) and Advanced Practice Providers (APPs -  Physician Assistants and Nurse Practitioners) who all work together to provide you with the care you need, when you need it.  We recommend signing up for the patient portal called "MyChart".  Sign up information is provided on this After Visit Summary.  MyChart is used to connect with patients for Virtual Visits (Telemedicine).  Patients are able to view lab/test results, encounter notes, upcoming appointments, etc.  Non-urgent messages can be sent to your provider as well.   To learn more about what you can do with MyChart, go to [email or web address].    Your next appointment:   6 month(s)  The format for your next appointment:   In Person  Provider:   Tiffany Avoca, MD       

## 2022-06-12 NOTE — Progress Notes (Signed)
Virtual Visit via Video Note   Because of Casey Hill's co-morbid illnesses, Casey Hill is at least at moderate risk for complications without adequate follow up.  This format is felt to be most appropriate for this patient at this time.  All issues noted in this document were discussed and addressed.  A limited physical exam was performed with this format.  Please refer to the patient's chart for his consent to telehealth for [facility].  The patient was identified using 2 identifiers.  Date:  06/12/2022   ID:  Casey Hill, DOB Mar 07, 1956, MRN [id number]  Patient Location: Home Provider Location: Office/Clinic  Visit was not converted to audio only  PCP:  Casey Huddle, MD  Cardiologist:  Casey Latch, MD  Electrophysiologist:  None   Evaluation Performed:  Follow-Up Visit  Chief Complaint:  Hypertension  History of Present Illness:    The patient does not have symptoms concerning for COVID-19 infection (fever, chills, cough, or new shortness of breath).   Casey Hill is a 66 y.o. male with a hx of hypertension and hyperlipidemia here for follow-up.  Casey Hill was first diagnosed with hypertension approximately 15 years ago.  His blood pressure was previously well-controlled.  However in 7062 Bystolic was discontinued.  Casey Hill was started on metoprolol and his blood pressure was not well have been controlled.  Therefore his PCP switched it to bisoprolol and it still remained elevated. Casey Hill was diagnosed with ADD as an adult.  Casey Hill has been on multiple different stimulant agents.  Most recently Casey Hill had been taking dextroamphetamine.  Casey Hill does not take it on the weekends.  Dr. Inda Hill reduced the dose to 5 mg daily from 10 mg. Casey Hill noted a difference in his ability to get through his day successfully since reducing the dose.  There wasn't a significant change in his blood pressure on 5 mg versus 10 mg.  Casey Hill did note that his blood pressure was better controlled on the weekends  when Casey Hill did not take it at all.    At his first appointment clonidine was switched to a patch.  Casey Hill was seen by our [facility] HT[facility] pharmacists three times.  Casey Hill was started on nebivolol and amlodipine was increased.  Bystolic was reduced to '10mg'$  due to bradycardia.  Casey Hill noted that the Bystolic did not seem to really affect his blood pressure very much.  Spironolactone was added and then increased to '50mg'$ .  Clonidine was also switched from tablets to a patch.  Casey Hill was not tested for hyperaldosteronism.  However given that there is no change in his blood pressure after adding spironolactone, this was thought to be unlikely to be the on the underlying cause.  Spironolactone was increased to 50 mg on 6/29.  Casey Hill has been enrolled in the remote patient monitoring system.  Overall Casey Hill had seen an improvement in his blood pressures.  His blood pressure remained poorly controlled and nebivolol was costly.  This was discontinued and Casey Hill was started on doxazosin.  His average blood pressure of on remote patient monitoring has been 129/81.  Casey Hill completed the exercise program at the [facility]   On 03/09/2022 his telmisartan-HCTZ 80-25 mg was switched to olmesartan-HCTZ 40-25 mg due to insurance coverage changes. Casey Hill underwent prostatectomy on 11/2021. At the last visit his blood pressures were controlled but Casey Hill noted new ankle edema. Amlodipine was reduced to 5 mg, and hydralazine was added.   At his last visit his blood pressures were running above  goal. Hydralazine was increased and Casey Hill sent his average BP for the last 2 weeks was 133/73. Prior to that his BP was averaging 146/77. Today, Casey Hill states that Casey Hill has been doing well. Casey Hill denies any negative side effects from the increased medication regimen. Casey Hill states that his BP earlier today was 116/68 when Casey Hill went in for lab work. Casey Hill is exercising 3 days a week at the [facility] and walking. Casey Hill denies any symptoms with exertional symptoms. His bilateral ankle swelling has improved significantly.  Casey Hill states that Casey Hill started using a CPAP in March 2023 and has not tolerated it well. Casey Hill wakes up early in the morning often due to his mask leaking. Casey Hill would like to consider having the inspire device implanted. Casey Hill is followed by sleep medicine.  Previous antihypertensives: Metoprolol Nebivolol  Past Medical History:  Diagnosis Date   ADD (attention deficit disorder) 11/13/2019   Essential hypertension 11/13/2019   GERD (gastroesophageal reflux disease)    OSA (obstructive sleep apnea) 04/11/2022   Pure hypercholesterolemia 11/13/2019    Past Surgical History:  Procedure Laterality Date   bilateral cataract surgery      CHOLECYSTECTOMY     LYMPHADENECTOMY Bilateral 12/04/2021   Procedure: LYMPHADENECTOMY, PELVIC;  Surgeon: Raynelle Bring, MD;  Location: [facility];  Service: Urology;  Laterality: Bilateral;   ROBOT ASSISTED LAPAROSCOPIC RADICAL PROSTATECTOMY N/A 12/04/2021   Procedure: XI ROBOTIC ASSISTED LAPAROSCOPIC RADICAL PROSTATECTOMY LEVEL 2;  Surgeon: Raynelle Bring, MD;  Location: [facility];  Service: Urology;  Laterality: N/A;    Current Medications: Current Meds  Medication Sig   amLODipine (NORVASC) 5 MG tablet Take 1 tablet (5 mg total) by mouth daily.   cloNIDine (CATAPRES - DOSED IN MG/24 HR) 0.2 mg/24hr patch APPLY 1 PATCH TO SKIN ONCE A WEEK   dextroamphetamine (DEXTROSTAT) 10 MG tablet Take 10 mg by mouth 2 (two) times daily.   docusate sodium (COLACE) 100 MG capsule Take 1 capsule (100 mg total) by mouth 2 (two) times daily.   doxazosin (CARDURA) 8 MG tablet Take 1 tablet (8 mg total) by mouth at bedtime.   eplerenone (INSPRA) 50 MG tablet TAKE 1 TABLET(50 MG) BY MOUTH DAILY   hydrALAZINE (APRESOLINE) 100 MG tablet Take 1 tablet (100 mg total) by mouth 3 (three) times daily.   olmesartan-hydrochlorothiazide (BENICAR HCT) 40-25 MG tablet Take 1 tablet by mouth daily.   Potassium 99 MG TABS Take 99 mg by mouth at bedtime.   rosuvastatin (CRESTOR) 20 MG tablet Take 20 mg by  mouth daily.   zolpidem (AMBIEN) 10 MG tablet Take 5 mg by mouth at bedtime as needed for sleep.   [DISCONTINUED] dextroamphetamine (DEXTROSTAT) 5 MG tablet Take 10 mg by mouth See admin instructions. Take twice daily when working     Allergies:   Aldactone [spironolactone]   Social History   Socioeconomic History   Marital status: Married    Spouse name: Not on file   Number of children: Not on file   Years of education: Not on file   Highest education level: Not on file  Occupational History   Not on file  Tobacco Use   Smoking status: Never   Smokeless tobacco: Never  Vaping Use   Vaping Use: Never used  Substance and Sexual Activity   Alcohol use: Yes    Alcohol/week: 7.0 standard drinks of alcohol    Types: 7 Glasses of wine per week    Comment: glass of wine nightly   Drug use: Never  Sexual activity: Not on file  Other Topics Concern   Not on file  Social History Narrative   Not on file   Social Determinants of Health   Financial Resource Strain: Not on file  Food Insecurity: Not on file  Transportation Needs: Not on file  Physical Activity: Not on file  Stress: Not on file  Social Connections: Not on file     Family History: The patient's family history includes Glaucoma in his father and mother; Healthy in his brother; Heart attack in his father; Hyperlipidemia in his father; Hypertension in his father; Prostate cancer in his father; Stroke in his mother.  ROS:   Please see the history of present illness.    (+) Bilateral ankle swelling, improving All other systems reviewed and are negative.  EKGs/Labs/Other Studies Reviewed:    Renal Artery Duplex 11/25/2019: Summary:  Renal:  Right: No evidence of right renal artery stenosis. RRV flow present.         Cyst(s) noted. Normal size right kidney. Normal right         Resisitive Index. Normal cortical thickness of right kidney.  Left:  No evidence of left renal artery stenosis. LRV flow present.          Normal size of left kidney. Normal left Resistive Index.         Normal cortical thickness of the left kidney.  Mesenteric:  Normal Celiac artery and Superior Mesenteric artery findings.   EKG:  EKG is personally reviewed. 04/11/2022:  Sinus rhythm. Rate 64 bpm. RBBB. LAD.   Recent Labs: 11/13/2021: Platelets 148 12/05/2021: BUN 27; Creatinine, Ser 1.13; Hemoglobin 12.6; Potassium 4.0; Sodium 136   Recent Lipid Panel No results found for: "CHOL", "TRIG", "HDL", "CHOLHDL", "VLDL", "LDLCALC", "LDLDIRECT"  Physical Exam:    BP 134/74   Pulse 75   Ht '5\' 11"'$  (1.803 m)   Wt 245 lb (111.1 kg)   BMI 34.17 kg/m  GENERAL: Well-appearing.  No acute distress. HEENT: Pupils equal round.  Oral mucosa unremarkable NECK:  No jugular venous distention, no visible thyromegaly EXT:  No edema, no cyanosis no clubbing SKIN:  No rashes no nodules NEURO:  Speech fluent.  Cranial nerves grossly intact.  Moves all 4 extremities freely PSYCH:  Cognitively intact, oriented to person place and time  ASSESSMENT:    1. Essential hypertension   2. OSA (obstructive sleep apnea)   3. Pure hypercholesterolemia     PLAN:    Essential hypertension BP is now much better controlled.  Still not quite at goal.  We will continue amlodipine, clonidine, doxazosin, eplerenone, hydralazine, olmesartan, and hydrochlorothiazide.  Casey Hill will keep working on his regular exercise and limiting sodium intake.  We did discuss the possibility of renal denervation given his multiple medications.  Casey Hill is going to think about this.   OSA (obstructive sleep apnea) Casey Hill has struggled with using his CPAP Machine.  Casey Hill heard about the Inspire device and will follow-up with the sleep medicine team to see if Casey Hill is a candidate.  Pure hypercholesterolemia Continue rosuvastatin.   Time:   Today, I have spent 12 minutes with the patient with telehealth technology discussing the above problems.    Disposition:    F/u in 6 months.    Medication Adjustments/Labs and Tests Ordered: Current medicines are reviewed at length with the patient today.  Concerns regarding medicines are outlined above.   No orders of the defined types were placed in this encounter.  No orders of the  defined types were placed in this encounter.  Patient Instructions  Medication Instructions:  Your physician recommends that you continue on your current medications as directed. Please refer to the Current Medication list given to you today.   *If you need a refill on your cardiac medications before your next appointment, please call your pharmacy*  Lab Work: NONE  Testing/Procedures: NONE  Follow-Up: At [facility], you and your health needs are our priority.  As part of our continuing mission to provide you with exceptional heart care, we have created designated Provider Care Teams.  These Care Teams include your primary Cardiologist (physician) and Advanced Practice Providers (APPs -  Physician Assistants and Nurse Practitioners) who all work together to provide you with the care you need, when you need it.  We recommend signing up for the patient portal called "MyChart".  Sign up information is provided on this After Visit Summary.  MyChart is used to connect with patients for Virtual Visits (Telemedicine).  Patients are able to view lab/test results, encounter notes, upcoming appointments, etc.  Non-urgent messages can be sent to your provider as well.   To learn more about what you can do with MyChart, go to [email or web address].    Your next appointment:   6 month(s)  The format for your next appointment:   In Person  Provider:   Skeet Latch, MD           I, Eugene Gavia, acting as a scribe for Casey Latch, MD.,have documented all relevant documentation on the behalf of Casey Latch, MD,as directed by  Casey Latch, MD while in the presence of Casey Latch, MD.  I, [place] Lin[place],  MD have reviewed all documentation for this visit.  The documentation of the exam, diagnosis, procedures, and orders on 06/12/2022 are all accurate and complete.   Signed, Casey Latch, MD  06/12/2022 10:35 AM    [place]

## 2022-06-12 NOTE — Telephone Encounter (Signed)
BP update

## 2022-06-12 NOTE — Assessment & Plan Note (Signed)
Continue rosuvastatin.  

## 2022-06-13 DIAGNOSIS — D225 Melanocytic nevi of trunk: Secondary | ICD-10-CM | POA: Diagnosis not present

## 2022-06-13 DIAGNOSIS — D2261 Melanocytic nevi of right upper limb, including shoulder: Secondary | ICD-10-CM | POA: Diagnosis not present

## 2022-06-13 DIAGNOSIS — Z85828 Personal history of other malignant neoplasm of skin: Secondary | ICD-10-CM | POA: Diagnosis not present

## 2022-06-13 DIAGNOSIS — L821 Other seborrheic keratosis: Secondary | ICD-10-CM | POA: Diagnosis not present

## 2022-07-16 DIAGNOSIS — G4733 Obstructive sleep apnea (adult) (pediatric): Secondary | ICD-10-CM | POA: Diagnosis not present

## 2022-07-23 ENCOUNTER — Other Ambulatory Visit: Payer: Self-pay | Admitting: Genetic Counselor

## 2022-07-23 DIAGNOSIS — C61 Malignant neoplasm of prostate: Secondary | ICD-10-CM

## 2022-07-25 ENCOUNTER — Inpatient Hospital Stay: Payer: BC Managed Care – PPO

## 2022-07-25 ENCOUNTER — Encounter: Payer: Self-pay | Admitting: Genetic Counselor

## 2022-07-25 ENCOUNTER — Inpatient Hospital Stay: Payer: BC Managed Care – PPO | Attending: Genetic Counselor | Admitting: Genetic Counselor

## 2022-07-25 DIAGNOSIS — C61 Malignant neoplasm of prostate: Secondary | ICD-10-CM

## 2022-07-25 DIAGNOSIS — Z8041 Family history of malignant neoplasm of ovary: Secondary | ICD-10-CM | POA: Diagnosis not present

## 2022-07-25 DIAGNOSIS — Z8042 Family history of malignant neoplasm of prostate: Secondary | ICD-10-CM

## 2022-07-25 LAB — GENETIC SCREENING ORDER

## 2022-07-25 NOTE — Progress Notes (Signed)
REFERRING PROVIDER: Josetta Huddle, MD [address] [place],  [place] [address]  PRIMARY PROVIDER:  Josetta Huddle, MD  PRIMARY REASON FOR VISIT:  1. Family history of prostate cancer   2. Family history of ovarian cancer   3. Prostate cancer ([place])      HISTORY OF PRESENT ILLNESS:   Casey Hill, a 66 y.o. male, was seen for a [place] cancer genetics consultation at the request of Dr. [name] due to a personal and family history of prostate cancer and a known ATM mutation in the family.  Casey Hill presents to clinic today to discuss the possibility of a hereditary predisposition to cancer, genetic testing, and to further clarify his future cancer risks, as well as potential cancer risks for family members.   In 2022, at the age of 26, Casey Hill was diagnosed with prostate cancer. The treatment plan included prostatectomy.  The Gleason score = 9.      CANCER HISTORY:  Oncology History   No history exists.     Past Medical History:  Diagnosis Date   ADD (attention deficit disorder) 11/13/2019   Essential hypertension 11/13/2019   Family history of ovarian cancer    Family history of prostate cancer    GERD (gastroesophageal reflux disease)    OSA (obstructive sleep apnea) 04/11/2022   Prostate cancer ([place])    Pure hypercholesterolemia 11/13/2019    Past Surgical History:  Procedure Laterality Date   bilateral cataract surgery      CHOLECYSTECTOMY     LYMPHADENECTOMY Bilateral 12/04/2021   Procedure: LYMPHADENECTOMY, PELVIC;  Surgeon: Raynelle Bring, MD;  Location: [facility];  Service: Urology;  Laterality: Bilateral;   ROBOT ASSISTED LAPAROSCOPIC RADICAL PROSTATECTOMY N/A 12/04/2021   Procedure: XI ROBOTIC ASSISTED LAPAROSCOPIC RADICAL PROSTATECTOMY LEVEL 2;  Surgeon: Raynelle Bring, MD;  Location: [facility];  Service: Urology;  Laterality: N/A;    Social History   Socioeconomic History   Marital status: Married    Spouse name: Not on file   Number of children:  Not on file   Years of education: Not on file   Highest education level: Not on file  Occupational History   Not on file  Tobacco Use   Smoking status: Never   Smokeless tobacco: Never  Vaping Use   Vaping Use: Never used  Substance and Sexual Activity   Alcohol use: Yes    Alcohol/week: 7.0 standard drinks of alcohol    Types: 7 Glasses of wine per week    Comment: glass of wine nightly   Drug use: Never   Sexual activity: Not on file  Other Topics Concern   Not on file  Social History Narrative   Not on file   Social Determinants of Health   Financial Resource Strain: Not on file  Food Insecurity: Not on file  Transportation Needs: Not on file  Physical Activity: Not on file  Stress: Not on file  Social Connections: Not on file     FAMILY HISTORY:  We obtained a detailed, 4-generation family history.  Significant diagnoses are listed below: Family History  Problem Relation Age of Onset   Stroke Mother    Glaucoma Mother    Heart attack Father    Hyperlipidemia Father    Hypertension Father    Prostate cancer Father 45   Glaucoma Father    Prostate cancer Brother 35       ATM+   Healthy Brother    Prostate cancer Cousin 39  Neg GT   Ovarian cancer Cousin 42       d. 62     The patient has two sons who are cancer free.  He has a bother with prostate cancer and a known ATM mutation.  Both parents are deceased.  The patient's father had prostate cancer at 12.  He had one sister who was cancer free.  She has a son who had prostate cancer with negative genetic testing, and a daughter who died of ovarian cancer.  There is another daughter who is cancer free.  The paternal grandparents were reportedly cancer free.  The patient's mother died of a stroke.  She had one brother who was cancer free.  Both maternal grandparents are deceased.  Casey Hill is aware of previous family history of genetic testing for hereditary cancer risks. Patient's maternal ancestors  are of [place] and [place] descent, and paternal ancestors are of [place] and [place] descent. There is no reported Ashkenazi Jewish ancestry. There is no known consanguinity.  GENETIC COUNSELING ASSESSMENT: Casey Hill is a 66 y.o. male with a personal and family history of prostate cancer which is somewhat suggestive of a hereditary cancer syndrome and predisposition to cancer given the number of men in the family with prostate cancer and a known ATM mutation in the family. We, therefore, discussed and recommended the following at today's visit.   DISCUSSION: We discussed that, in general, most cancer is not inherited in families, but instead is sporadic or familial. Sporadic cancers occur by chance and typically happen at older ages (>50 years) as this type of cancer is caused by genetic changes acquired during an individual's lifetime. Some families have more cancers than would be expected by chance; however, the ages or types of cancer are not consistent with a known genetic mutation or known genetic mutations have been ruled out. This type of familial cancer is thought to be due to a combination of multiple genetic, environmental, hormonal, and lifestyle factors. While this combination of factors likely increases the risk of cancer, the exact source of this risk is not currently identifiable or testable.  We discussed that up to 15% of prostate cancer is hereditary, with most cases associated with BRCA mutations.  There are other genes that can be associated with hereditary prostate cancer syndromes.  These include ATM and other DNA repair genes.  We discussed that testing is beneficial for several reasons including knowing how to follow individuals after completing their treatment, identifying whether potential treatment options such as PARP inhibitors would be beneficial, and understand if other family members could be at risk for cancer and allow them to undergo genetic testing.   Casey Hill  indicated that his brother underwent genetic testing and was found to have an ATM pathogenic mutation.  We discussed that he has a 50% chance of also having this mutation.  We reviewed the cancer risks for ATM mutations.  Breast Cancer    Absolute risk: 20-40% Screening: Annual mammogram with consideration of tomosynthesis at age 11 and consider breast MRI with contrast starting at age 74 - 41 years. Risk Reducing Mastectomy: Evidence insufficient, consider based on family history  Ovarian Cancer  Absolute risk: 2-3% Evidence is insufficient for risk-reducing salpingo-oophorectomy (RRSO).  This risk should be managed by family history.  Pancreatic Cancer   Absolute risk: ~5-10% Screen starting at age 8 for individuals with a family history of pancreatic cancer.  Prostate Cancer Emerging evidence for association with increased risk.  No recommendations for  screening at this time.  We reviewed the characteristics, features and inheritance patterns of hereditary cancer syndromes. We also discussed genetic testing, including the appropriate family members to test, the process of testing, insurance coverage and turn-around-time for results. We discussed the implications of a negative, positive, carrier and/or variant of uncertain significant result. Casey Hill  was offered a common hereditary cancer panel (47 genes) and an expanded pan-cancer panel (77 genes). Casey Hill was informed of the benefits and limitations of each panel, including that expanded pan-cancer panels contain genes that do not have clear management guidelines at this point in time.  We also discussed that as the number of genes included on a panel increases, the chances of variants of uncertain significance increases. Casey Hill decided to pursue genetic testing for the CancerNext-expanded+RNAinsight gene panel.   The CancerNext-Expanded gene panel offered by [facility] and includes sequencing and rearrangement analysis for  the following 77 genes: AIP, ALK, APC*, ATM*, AXIN2, BAP1, BARD1, BLM, BMPR1A, BRCA1*, BRCA2*, BRIP1*, CDC73, CDH1*, CDK4, CDKN1B, CDKN2A, CHEK2*, CTNNA1, DICER1, FANCC, FH, FLCN, GALNT12, KIF1B, LZTR1, MAX, MEN1, MET, MLH1*, MSH2*, MSH3, MSH6*, MUTYH*, NBN, NF1*, NF2, NTHL1, PALB2*, PHOX2B, PMS2*, POT1, PRKAR1A, PTCH1, PTEN*, RAD51C*, RAD51D*, RB1, RECQL, RET, SDHA, SDHAF2, SDHB, SDHC, SDHD, SMAD4, SMARCA4, SMARCB1, SMARCE1, STK11, SUFU, TMEM127, TP53*, TSC1, TSC2, VHL and XRCC2 (sequencing and deletion/duplication); EGFR, EGLN1, HOXB13, KIT, MITF, PDGFRA, POLD1, and POLE (sequencing only); EPCAM and GREM1 (deletion/duplication only). DNA and RNA analyses performed for * genes.   Based on Casey Hill personal and family history of cancer, he meets medical criteria for genetic testing. Despite that he meets criteria, he may still have an out of pocket cost. We discussed that if his out of pocket cost for testing is over $100, the laboratory will call and confirm whether he wants to proceed with testing.  If the out of pocket cost of testing is less than $100 he will be billed by the genetic testing laboratory.   PLAN: After considering the risks, benefits, and limitations, Casey Hill provided informed consent to pursue genetic testing and the blood sample was sent to [facility] for analysis of the CancerNext-Expanded+RNAinsight panel. Results should be available within approximately 2-3 weeks' time, at which point they will be disclosed by telephone to Casey Hill, as will any additional recommendations warranted by these results. Casey Hill will receive a summary of his genetic counseling visit and a copy of his results once available. This information will also be available in Epic.   Lastly, we encouraged Casey Hill to remain in contact with cancer genetics annually so that we can continuously update the family history and inform him of any changes in cancer genetics and testing that may be  of benefit for this family.   Casey Hill questions were answered to his satisfaction today. Our contact information was provided should additional questions or concerns arise. Thank you for the referral and allowing [place] to share in the care of your patient.   Casey Hill P. Florene Glen, [place], [facility] Licensed, Insurance risk surveyor [email or web address] phone: [phone]  The patient was seen for a total of 40 minutes in face-to-face genetic counseling.  The patient was seen alone.  Drs. Michell Heinrich, and/or [place] were available for questions, if needed..    _______________________________________________________________________ For Office Staff:  Number of people involved in session: 1 Was an Intern/ student involved with case: no

## 2022-08-09 ENCOUNTER — Encounter: Payer: Self-pay | Admitting: Genetic Counselor

## 2022-08-09 ENCOUNTER — Telehealth: Payer: Self-pay | Admitting: Genetic Counselor

## 2022-08-09 DIAGNOSIS — Z1379 Encounter for other screening for genetic and chromosomal anomalies: Secondary | ICD-10-CM | POA: Insufficient documentation

## 2022-08-09 NOTE — Telephone Encounter (Signed)
Revealed negative genetic testing.  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  No medical management changes are recommended based on testing.

## 2022-08-15 ENCOUNTER — Ambulatory Visit: Payer: Self-pay | Admitting: Genetic Counselor

## 2022-08-15 DIAGNOSIS — Z1379 Encounter for other screening for genetic and chromosomal anomalies: Secondary | ICD-10-CM

## 2022-08-15 NOTE — Progress Notes (Signed)
HPI:  Mr. Minner was previously seen in the [facility] clinic due to a personal and family history of cancer and concerns regarding a hereditary predisposition to cancer due to a known ATM pathogenic mutation in the family. Please refer to our prior cancer genetics clinic note for more information regarding our discussion, assessment and recommendations, at the time. Mr. Spanos recent genetic test results were disclosed to him, as were recommendations warranted by these results. These results and recommendations are discussed in more detail below.  CANCER HISTORY:  Oncology History  Prostate cancer ([place])  12/04/2021 Initial Diagnosis   Prostate cancer ([place])   08/09/2022 Genetic Testing   FANCC  p.G401R (c.1201G>A) VUS identified on the CancerNext-Expanded+RNAinsight panel.  Of note, the familial ATM variant was not identified.  The report date is 08/09/2022.  The CancerNext-Expanded gene panel offered by [facility] and includes sequencing and rearrangement analysis for the following 77 genes: AIP, ALK, APC*, ATM*, AXIN2, BAP1, BARD1, BLM, BMPR1A, BRCA1*, BRCA2*, BRIP1*, CDC73, CDH1*, CDK4, CDKN1B, CDKN2A, CHEK2*, CTNNA1, DICER1, FANCC, FH, FLCN, GALNT12, KIF1B, LZTR1, MAX, MEN1, MET, MLH1*, MSH2*, MSH3, MSH6*, MUTYH*, NBN, NF1*, NF2, NTHL1, PALB2*, PHOX2B, PMS2*, POT1, PRKAR1A, PTCH1, PTEN*, RAD51C*, RAD51D*, RB1, RECQL, RET, SDHA, SDHAF2, SDHB, SDHC, SDHD, SMAD4, SMARCA4, SMARCB1, SMARCE1, STK11, SUFU, TMEM127, TP53*, TSC1, TSC2, VHL and XRCC2 (sequencing and deletion/duplication); EGFR, EGLN1, HOXB13, KIT, MITF, PDGFRA, POLD1, and POLE (sequencing only); EPCAM and GREM1 (deletion/duplication only). DNA and RNA analyses performed for * genes.      FAMILY HISTORY:  We obtained a detailed, 4-generation family history.  Significant diagnoses are listed below: Family History  Problem Relation Age of Onset   Stroke Mother    Glaucoma Mother    Heart attack Father     Hyperlipidemia Father    Hypertension Father    Prostate cancer Father 3   Glaucoma Father    Prostate cancer Brother 61       ATM+   Healthy Brother    Prostate cancer Cousin 45       Neg GT   Ovarian cancer Cousin 42       d. 37       The patient has two sons who are cancer free.  He has a bother with prostate cancer and a known ATM mutation.  Both parents are deceased.   The patient's father had prostate cancer at 2.  He had one sister who was cancer free.  She has a son who had prostate cancer with negative genetic testing, and a daughter who died of ovarian cancer.  There is another daughter who is cancer free.  The paternal grandparents were reportedly cancer free.   The patient's mother died of a stroke.  She had one brother who was cancer free.  Both maternal grandparents are deceased.   Mr. Ellers is aware of previous family history of genetic testing for hereditary cancer risks. Patient's maternal ancestors are of [place] and [place] descent, and paternal ancestors are of [place] and [place] descent. There is no reported Ashkenazi Jewish ancestry. There is no known consanguinity.  GENETIC TEST RESULTS: Genetic testing reported out on August 09, 2022 through the CancerNext-Expanded+RNAinsight cancer panel found no pathogenic mutations. The CancerNext-Expanded gene panel offered by [facility] and includes sequencing and rearrangement analysis for the following 77 genes: AIP, ALK, APC*, ATM*, AXIN2, BAP1, BARD1, BLM, BMPR1A, BRCA1*, BRCA2*, BRIP1*, CDC73, CDH1*, CDK4, CDKN1B, CDKN2A, CHEK2*, CTNNA1, DICER1, FANCC, FH, FLCN, GALNT12, KIF1B, LZTR1, MAX, MEN1, MET, MLH1*,  MSH2*, MSH3, MSH6*, MUTYH*, NBN, NF1*, NF2, NTHL1, PALB2*, PHOX2B, PMS2*, POT1, PRKAR1A, PTCH1, PTEN*, RAD51C*, RAD51D*, RB1, RECQL, RET, SDHA, SDHAF2, SDHB, SDHC, SDHD, SMAD4, SMARCA4, SMARCB1, SMARCE1, STK11, SUFU, TMEM127, TP53*, TSC1, TSC2, VHL and XRCC2 (sequencing and deletion/duplication); EGFR, EGLN1,  HOXB13, KIT, MITF, PDGFRA, POLD1, and POLE (sequencing only); EPCAM and GREM1 (deletion/duplication only). DNA and RNA analyses performed for * genes. Specifically, Mr. Andrada test was normal and did not reveal the familial ATM mutation. We call this result a true negative result because the cancer-causing mutation was identified in Mr. Sissel family, and he did not inherit it.  The test report has been scanned into EPIC and is located under the Molecular Pathology section of the Results Review tab.  A portion of the result report is included below for reference.     We discussed with Mr. Fronczak that because current genetic testing is not perfect, it is possible there may be a gene mutation in one of these genes that current testing cannot detect, but that chance is small.  We also discussed, that there could be another gene that has not yet been discovered, or that we have not yet tested, that is responsible for the cancer diagnoses in the family. It is also possible there is a hereditary cause for the cancer in the family that Mr. Canizalez did not inherit and therefore was not identified in his testing.  Therefore, it is important to remain in touch with cancer genetics in the future so that we can continue to offer Mr. Likins the most up to date genetic testing.   Genetic testing did identify a variant of uncertain significance (VUS) was identified in the [facility] gene called c.1201G>A (p.G401R).  At this time, it is unknown if this variant is associated with increased cancer risk or if this is a normal finding, but most variants such as this get reclassified to being inconsequential. It should not be used to make medical management decisions. With time, we suspect the lab will determine the significance of this variant, if any. If we do learn more about it, we will try to contact Mr. Arpino to discuss it further. However, it is important to stay in touch with [place] periodically and keep the address and phone  number up to date.  ADDITIONAL GENETIC TESTING: We discussed with Mr. Pizzo that his genetic testing was fairly extensive.  If there are genes identified to increase cancer risk that can be analyzed in the future, we would be happy to discuss and coordinate this testing at that time.    CANCER SCREENING RECOMMENDATIONS: Mr. Birchard test result is considered negative (normal).  This means that we have not identified a hereditary cause for his personal history of prostate cancer at this time, even if the cancer's in his family could be due to the ATM mutation identified in his brother. Most cancers happen by chance and this negative test suggests that his cancer may fall into this category.    While reassuring, this does not definitively rule out a hereditary predisposition to cancer. It is still possible that there could be genetic mutations that are undetectable by current technology. There could be genetic mutations in genes that have not been tested or identified to increase cancer risk.  Therefore, it is recommended he continue to follow the cancer management and screening guidelines provided by his oncology and primary healthcare provider.   An individual's cancer risk and medical management are not determined by genetic test results alone. Overall  cancer risk assessment incorporates additional factors, including personal medical history, family history, and any available genetic information that may result in a personalized plan for cancer prevention and surveillance  RECOMMENDATIONS FOR FAMILY MEMBERS:  Individuals in this family might be at some increased risk of developing cancer, over the general population risk, simply due to the family history of cancer.  We recommended women in this family have a yearly mammogram beginning at age 56, or 23 years younger than the earliest onset of cancer, an annual clinical breast exam, and perform monthly breast self-exams. Women in this family should also  have a gynecological exam as recommended by their primary provider. All family members should be referred for colonoscopy starting at age 44.  Despite that Mr. Gibbons does not have the familial ATM mutation, It is possible that other family members could have inherited the familial ATM mutation that he did not inherit.  Based on Mr. Dicenzo family history, we recommended his maternal cousins (and paternal cousins who have not been tested) have genetic counseling and testing. Mr. Trickett will let us know if we can be of any assistance in coordinating genetic counseling and/or testing for this family member.   FOLLOW-UP: Lastly, we discussed with Mr. Lumadue that cancer genetics is a rapidly advancing field and it is possible that new genetic tests will be appropriate for him and/or his family members in the future. We encouraged him to remain in contact with cancer genetics on an annual basis so we can update his personal and family histories and let him know of advances in cancer genetics that may benefit this family.   Our contact number was provided. Mr. Clausing questions were answered to his satisfaction, and he knows he is welcome to call us at anytime with additional questions or concerns.   Roma Kayser, [place], [facility] Licensed, Certified Genetic Counselor [email or web address]

## 2022-08-29 ENCOUNTER — Other Ambulatory Visit (HOSPITAL_BASED_OUTPATIENT_CLINIC_OR_DEPARTMENT_OTHER): Payer: Self-pay | Admitting: Cardiovascular Disease

## 2022-08-30 ENCOUNTER — Other Ambulatory Visit (HOSPITAL_BASED_OUTPATIENT_CLINIC_OR_DEPARTMENT_OTHER): Payer: Self-pay | Admitting: Cardiovascular Disease

## 2022-08-30 NOTE — Telephone Encounter (Signed)
Rx request sent to pharmacy.  

## 2022-08-31 NOTE — Telephone Encounter (Signed)
Rx(s) sent to pharmacy electronically.  

## 2022-09-26 DIAGNOSIS — H52203 Unspecified astigmatism, bilateral: Secondary | ICD-10-CM | POA: Diagnosis not present

## 2022-09-26 DIAGNOSIS — H26493 Other secondary cataract, bilateral: Secondary | ICD-10-CM | POA: Diagnosis not present

## 2022-09-26 DIAGNOSIS — H35372 Puckering of macula, left eye: Secondary | ICD-10-CM | POA: Diagnosis not present

## 2022-09-28 DIAGNOSIS — C61 Malignant neoplasm of prostate: Secondary | ICD-10-CM | POA: Diagnosis not present

## 2022-09-28 DIAGNOSIS — N5201 Erectile dysfunction due to arterial insufficiency: Secondary | ICD-10-CM | POA: Diagnosis not present

## 2022-09-28 DIAGNOSIS — N393 Stress incontinence (female) (male): Secondary | ICD-10-CM | POA: Diagnosis not present

## 2022-10-17 DIAGNOSIS — H26492 Other secondary cataract, left eye: Secondary | ICD-10-CM | POA: Diagnosis not present

## 2022-10-18 ENCOUNTER — Telehealth: Payer: Self-pay

## 2022-10-18 NOTE — Telephone Encounter (Signed)
    1. I confirmed with the patient he is aware of his referral to the clinic 3/19, arriving @ 12:45 pm.    2. I discussed the format of the clinic and the physicians he will be seeing that day.   3. I discussed where the clinic is located and how to contact me.   4. I confirmed his address and informed him I would be mailing a packet of information and forms to be completed. I asked him to bring them with him the day of his appointment.    He voiced understanding of the above. I asked him to call me if he has any questions or concerns regarding his appointments or the forms he needs to complete.

## 2022-10-22 ENCOUNTER — Other Ambulatory Visit (HOSPITAL_COMMUNITY): Payer: Self-pay | Admitting: Urology

## 2022-10-22 DIAGNOSIS — C61 Malignant neoplasm of prostate: Secondary | ICD-10-CM

## 2022-10-24 DIAGNOSIS — H26491 Other secondary cataract, right eye: Secondary | ICD-10-CM | POA: Diagnosis not present

## 2022-10-31 DIAGNOSIS — Z0000 Encounter for general adult medical examination without abnormal findings: Secondary | ICD-10-CM | POA: Diagnosis not present

## 2022-10-31 DIAGNOSIS — E782 Mixed hyperlipidemia: Secondary | ICD-10-CM | POA: Diagnosis not present

## 2022-10-31 DIAGNOSIS — I10 Essential (primary) hypertension: Secondary | ICD-10-CM | POA: Diagnosis not present

## 2022-10-31 DIAGNOSIS — Z23 Encounter for immunization: Secondary | ICD-10-CM | POA: Diagnosis not present

## 2022-10-31 DIAGNOSIS — F909 Attention-deficit hyperactivity disorder, unspecified type: Secondary | ICD-10-CM | POA: Diagnosis not present

## 2022-11-02 ENCOUNTER — Ambulatory Visit (HOSPITAL_COMMUNITY)
Admission: RE | Admit: 2022-11-02 | Discharge: 2022-11-02 | Disposition: A | Discharge status: Home / Self Care | Payer: BC Managed Care – PPO | Source: Ambulatory Visit | Attending: Urology | Admitting: Urology

## 2022-11-02 DIAGNOSIS — R972 Elevated prostate specific antigen [PSA]: Secondary | ICD-10-CM | POA: Diagnosis not present

## 2022-11-02 DIAGNOSIS — C61 Malignant neoplasm of prostate: Secondary | ICD-10-CM | POA: Diagnosis not present

## 2022-11-02 MED ORDER — PIFLIFOLASTAT F 18 (PYLARIFY) INJECTION
9.0000 | Freq: Once | INTRAVENOUS | Status: AC
  Administered 2022-11-02: 9.45 via INTRAVENOUS

## 2022-11-05 NOTE — Progress Notes (Signed)
RN spoke with patient and confirmed upcoming [facility] appointment.

## 2022-11-06 ENCOUNTER — Other Ambulatory Visit: Payer: Self-pay

## 2022-11-06 ENCOUNTER — Encounter (HOSPITAL_BASED_OUTPATIENT_CLINIC_OR_DEPARTMENT_OTHER): Payer: Self-pay | Admitting: Cardiovascular Disease

## 2022-11-06 ENCOUNTER — Encounter: Payer: Self-pay | Admitting: Radiation Oncology

## 2022-11-06 ENCOUNTER — Other Ambulatory Visit (HOSPITAL_COMMUNITY): Payer: BC Managed Care – PPO

## 2022-11-06 ENCOUNTER — Ambulatory Visit
Admission: RE | Admit: 2022-11-06 | Discharge: 2022-11-06 | Disposition: A | Discharge status: Home / Self Care | Payer: BC Managed Care – PPO | Source: Ambulatory Visit | Attending: Radiation Oncology | Admitting: Radiation Oncology

## 2022-11-06 VITALS — BP 131/73 | HR 97 | Temp 97.7°F | Resp 20 | Ht 71.5 in | Wt 249.2 lb

## 2022-11-06 DIAGNOSIS — C61 Malignant neoplasm of prostate: Secondary | ICD-10-CM

## 2022-11-06 DIAGNOSIS — Z191 Hormone sensitive malignancy status: Secondary | ICD-10-CM | POA: Diagnosis not present

## 2022-11-06 DIAGNOSIS — C775 Secondary and unspecified malignant neoplasm of intrapelvic lymph nodes: Secondary | ICD-10-CM | POA: Diagnosis not present

## 2022-11-06 DIAGNOSIS — R9721 Rising PSA following treatment for malignant neoplasm of prostate: Secondary | ICD-10-CM | POA: Diagnosis not present

## 2022-11-06 NOTE — Telephone Encounter (Signed)
Aortic atherosclerosis dates back to 2000. Coronary atherosclerosis appears new. His Rosuvastatin helps to prevent this from progressing. Recommend virtual or office visit to assess if this is causing any symptoms and requires any further workup. Coronary atherosclerosis is very common and we are most concerned if it is causing symptoms (like chest pain or exertional shortness of breath) and that we are preventing it from progressing.   Loel Dubonnet, NP

## 2022-11-06 NOTE — Consult Note (Signed)
Multi-Disciplinary Clinic     11/06/2022   --------------------------------------------------------------------------------   Casey Hill  MRN: [id number]  DOB: 09-07-1955, 67 year old Male  SSN:    PRIMARY CARE:  R Marcellus Scott Retired, MD  [place]:  [phone]  REFERRING:  Ammie Dalton, NP  PROVIDER:  Raynelle Bring, M.D.  LOCATION:  [facility] [phone]     --------------------------------------------------------------------------------   CC/HPI: CC: Prostate Cancer   PCP: Dr. Okey Dupre  Location of consult: [facility]   Mr. Casey Hill is a 67 year old gentleman whose father died of metastatic prostate cancer at age 19. He was initially diagnosed with favorable intermediate risk prostate cancer in August 2022. His PSA was 5.0 and he underwent a biopsy indicating 3 out of 12 biopsy cores positive with 2 of the 3 positive cores harboring low volume Gleason 3+4=7 adenocarcinoma noted. He elected initial active surveillance management. He underwent an MRI on 10/05/21 and this demonstrated two separate PI-RADS 4 lesions with ROI-1 at the right mid gland measured at 8 mm and ROI-2 as a focal lesion at the right mid lateral gland. His PSA had increased to 5.8. He underwent an MR/US fusion biopsy on 10/16/21. This demonstrated upgraded Gleason 4+4=8 adenocarcinoma noted in his targeted biopsies now demonstrating high risk disease. Overall, 5 out of 6 targeted biopsies were positive and 2 out of 12 systematic biopsies for a total of 7 out of 18 cores positive. Attempts were made to obtain a PSMA PET scan for staging purposes but this was denied by [facility] despite request for appeal. Conventional staging imaging was performed and was negative.   He elected to proceed with primary surgical therapy after discussing options and underwent a UNS RAL radical prostatectomy and BPLND on 12/04/21. Pathology  demonstrated pT2 N0 Mx, Gleason 4+5=9 adenocarcinoma with negative surgical margins. His initial PSA done at an outside lab in July 2023 was undetectable. His PSA was then noted to have increased to 0.6 in January 2024 and confirmed to be 0.53 on 09/28/22. He underwent a PSMA PET scan on 11/02/2022. This did demonstrate an isolated right hypogastric lymph node that measured approximately 6 mm but was definitely avid on PSMA pet imaging as an isolated, solitary metastatic site.     ALLERGIES: No Allergies    MEDICATIONS: Sildenafil Citrate 20 mg tablet 2-5 tablets as needed for intimacy  Amlodipine Besylate 10 mg tablet  Dextroamphetamine Sulfate 10 mg tablet  Doxazosin Mesylate 8 mg tablet  Fish Oil 1,000 mg (120 mg-180 mg) capsule  Inspra 50 mg tablet  Omega 3  Potassium 600 mg (99 mg) tablet  Rosuvastatin Calcium 20 mg tablet  Spironolactone 25 mg tablet  Telmisartan-Hydrochlorothiazid 80 mg-25 mg tablet  Vitamin D3  Zolpidem Tartrate 10 mg tablet     GU PSH: Laparoscopy; Lymphadenectomy - 12/04/2021 Locm 300-399Mg /Ml Iodine,1Ml - 12/01/2021 Prostate Needle Biopsy - 10/16/2021, 04/14/2021 Robotic Radical Prostatectomy - 12/04/2021     NON-GU PSH: Cataract surgery, 2022 Remove Gallbladder, 2000 Surgical Pathology, Gross And Microscopic Examination For Prostate Needle - 10/16/2021, 04/14/2021     GU PMH: ED due to arterial insufficiency - 09/28/2022, - 03/16/2022 (Stable), - 12/12/2021, - 12/05/2020 Prostate Cancer - 09/28/2022, - 03/16/2022, - 01/04/2022, - 12/21/2021, - 12/12/2021, - 12/01/2021, - 11/14/2021, - 11/09/2021, - 10/23/2021, - 10/16/2021, - 04/20/2021 Rising PSA after prostate cancer treatment - 09/28/2022 Stress Incontinence - 09/28/2022, - 03/16/2022, - 03/15/2022, - 01/30/2022, -  01/04/2022, - 12/21/2021, - 12/12/2021 Hydrocele - 12/20/2021 Elevated PSA - 04/14/2021, - 03/06/2021, - 12/05/2020      PMH Notes:   1) Prostate cancer: He is s/p a UNS RAL radical prostatectomy and BPLND on 12/04/21.    Diagnosis: pT2 N0 M0, Gleason 4+5=9 adenocarcinoma with negative surgical margins  Pretreatment PSA: 5.8  Pretreatment SHIM score: 2   NON-GU PMH: Hypercholesterolemia Hypertension    FAMILY HISTORY: 2 sons - Son Heart Disease - Father Prostate Cancer - Father stroke - Mother   SOCIAL HISTORY: Marital Status: Married Preferred Language: English; Ethnicity: Not Hispanic Or Latino; Race: White Current Smoking Status: Patient has never smoked.   Tobacco Use Assessment Completed: Used Tobacco in last 30 days? Social Drinker.  Drinks 1 caffeinated drink per day. Patient's occupation Software engineer.    REVIEW OF SYSTEMS:    GU Review Male:   Patient denies frequent urination, hard to postpone urination, burning/ pain with urination, get up at night to urinate, leakage of urine, stream starts and stops, trouble starting your streams, and have to strain to urinate .  Gastrointestinal (Lower):   Patient denies diarrhea and constipation.  Gastrointestinal (Upper):   Patient denies nausea and vomiting.  Constitutional:   Patient denies fever, night sweats, weight loss, and fatigue.  Skin:   Patient denies skin rash/ lesion and itching.  Eyes:   Patient denies blurred vision and double vision.  Ears/ Nose/ Throat:   Patient denies sore throat and sinus problems.  Hematologic/Lymphatic:   Patient denies swollen glands and easy bruising.  Cardiovascular:   Patient denies leg swelling and chest pains.  Respiratory:   Patient denies cough and shortness of breath.  Endocrine:   Patient denies excessive thirst.  Musculoskeletal:   Patient denies back pain and joint pain.  Neurological:   Patient denies headaches and dizziness.  Psychologic:   Patient denies depression and anxiety.   VITAL SIGNS: None   MULTI-SYSTEM PHYSICAL EXAMINATION:    Constitutional: Well-nourished. No physical deformities. Normally developed. Good grooming.     Complexity of Data:  Lab Test Review:    PSA  Records Review:   Previous Patient Records  X-Ray Review: PET- PSMA Scan: Reviewed Films.     09/28/22 09/19/22 10/02/21 12/05/20  PSA  Total PSA 0.53 ng/mL 0.6 ng/ml 5.8 ng/ml 5.0 ng/ml    PROCEDURES: None   ASSESSMENT:      ICD-10 Details  1 GU:   Prostate Cancer - C61   2   Rising PSA after prostate cancer treatment - R97.21   3   Metastatic pelvic lymphadenopathy - C77.5    PLAN:           Schedule Labs: 6 Months - Urinalysis    6 Months - PSA    6 Months - Total Testosterone  Return Visit/Planned Activity: 6 Months - Office Visit          Document Letter(s):  Created for Patient: Clinical Summary         Notes:   1. Biochemically recurrent prostate cancer: We reviewed his PSMA PET scan indicating an isolated right pelvic lymph node metastatic site. After reviewing options, it was recommended that he consider short-term androgen deprivation therapy for 6 months along with salvage radiation therapy to the prostatic fossa and pelvic lymph nodes including boosted therapy to his isolated metastatic right pelvic lymph node. He is scheduled to meet with Dr. Tammi Klippel today. We reviewed the potential side effects of androgen deprivation therapy and  radiation therapy in detail and all questions have been answered to his stated satisfaction. We will begin authorization for Eligard 45 mg to start ASAP. He will then follow-up in approximately 6 months to check his next PSA.   CC: Dr. Tyler Pita    E & M CODES: We spent 33 minutes dedicated to evaluation and management time, including face to face interaction, discussions on coordination of care, documentation, result review, and discussion with others as applicable.

## 2022-11-06 NOTE — Progress Notes (Signed)
                               Care Plan Summary  Name: Travas Orndoff DOB: 07-15-1956   Your Medical Team:   Urologist -  Dr. Raynelle Bring, [facility]  Radiation Oncologist - Dr. Tyler Pita, [place]     Recommendations: 1) Hormonal Therapy  2) Salvage Radiation     * These recommendations are based on information available as of today's consult.      Recommendations may change depending on the results of further tests or exams.    Next Steps: 1) You will be contacted with an appointment to start your hormonal therapy.   2) You will also be contacted to schedule a CT Simulation that will be followed by daily radiation.     When appointments need to be scheduled, you will be contacted by [facility] and/or [facility].  Questions?  Please do not hesitate to call Katheren Puller, BSN, RN at [phone] with any questions or concerns.  Kathlee Nations is your Oncology Nurse Navigator and is available to assist you while you're receiving your medical care at [facility].

## 2022-11-06 NOTE — Progress Notes (Signed)
Radiation Oncology         [phone] ________________________________  Multidisciplinary Prostate Cancer Clinic  Initial Radiation Oncology Consultation  Name: Casey Hill MRN: [id number]  Date: 11/06/2022  DOB: Dec 21, 1955  HK:[id number], Walker Shadow, MD  Raynelle Bring, MD   REFERRING PHYSICIAN: Raynelle Bring, MD  DIAGNOSIS: 67 y.o. gentleman with biochemically recurrent, oligometastatic prostate cancer with a postoperative PSA of 0.53 and solitary PSMA-avid pelvic node s/p RALP 11/2021 for Stage pT2N0, Gleason 4+5 prostate cancer    ICD-10-CM   1. Prostate cancer [facility])  C61       HISTORY OF PRESENT ILLNESS: Casey Hill is a 67 y.o. male with a diagnosis of biochemically recurrent, oligometastatic prostate cancer. He was initially diagnosed with low volume Gleason 3+4 prostate cancer on 04/14/21 by Dr. Louis Meckel, with a PSA of 5.7.     At that time, he opted to proceed with active surveillance. A surveillance prostate MRI was performed on 10/05/21 showing two PI-RADS 4 lesions on the right side. He underwent an MRI fusion fusion biopsy on 10/16/21 revealing Gleason 4+4 disease within the right lateral MRI lesion.     A PSMA PET was ordered for disease staging but denied by his insurance at the time so he underwent staging with a CT A/P and bone scan, both of which were negative for metastatic disease. After extensive counseling regarding treatment options, he elected to proceed with RALP, performed on 12/04/21 under the care of Dr. Alinda Money. Final surgical pathology revealed Gleason 4+5 prostatic adenocarcinoma with negative margins and no seminal vesicle or lymph node involvement (0/10). His postoperative PSA was initially undetectable in 02/2022.     Unfortunately, his PSA became detectable at 0.6 on 09/19/22 labs per outside records. This was repeated at [facility] on 09/28/22 and confirmed to remain detectable at 0.53. This prompted a re-staging PSMA PET scan which was  approved and performed on 11/02/22 showing a solitary PSMA-avid right pelvic sidewall/hypogastric lymph node but no additional evidence of metastatic disease.    Of note, the patient underwent genetic counseling and testing through the [facility] in 07/2022. Despite a known ATM mutation in his family, the patient's results were negative with a VUS within [facility].  The patient reviewed the imaging and lab results with his urologist and he has kindly been referred today to the multidisciplinary prostate cancer clinic for presentation of pathology and radiology studies in our conference for discussion of potential radiation treatment options and clinical evaluation.   PREVIOUS RADIATION THERAPY: No  PAST MEDICAL HISTORY:  Past Medical History:  Diagnosis Date   ADD (attention deficit disorder) 11/13/2019   Essential hypertension 11/13/2019   Family history of ovarian cancer    Family history of prostate cancer    GERD (gastroesophageal reflux disease)    OSA (obstructive sleep apnea) 04/11/2022   Prostate cancer ([place])    Pure hypercholesterolemia 11/13/2019      PAST SURGICAL HISTORY: Past Surgical History:  Procedure Laterality Date   bilateral cataract surgery      CHOLECYSTECTOMY     LYMPHADENECTOMY Bilateral 12/04/2021   Procedure: LYMPHADENECTOMY, PELVIC;  Surgeon: Raynelle Bring, MD;  Location: [facility];  Service: Urology;  Laterality: Bilateral;   ROBOT ASSISTED LAPAROSCOPIC RADICAL PROSTATECTOMY N/A 12/04/2021   Procedure: XI ROBOTIC ASSISTED LAPAROSCOPIC RADICAL PROSTATECTOMY LEVEL 2;  Surgeon: Raynelle Bring, MD;  Location: [facility];  Service: Urology;  Laterality: N/A;    FAMILY HISTORY:  Family History  Problem Relation Age of Onset   Stroke  Mother    Glaucoma Mother    Heart attack Father    Hyperlipidemia Father    Hypertension Father    Prostate cancer Father 35   Glaucoma Father    Prostate cancer Brother 83       ATM+   Healthy Brother    Prostate cancer Cousin 86        Neg GT   Ovarian cancer Cousin 42       d. 34    SOCIAL HISTORY:  Social History   Socioeconomic History   Marital status: Married    Spouse name: Not on file   Number of children: Not on file   Years of education: Not on file   Highest education level: Not on file  Occupational History   Not on file  Tobacco Use   Smoking status: Never   Smokeless tobacco: Never  Vaping Use   Vaping Use: Never used  Substance and Sexual Activity   Alcohol use: Yes    Alcohol/week: 7.0 standard drinks of alcohol    Types: 7 Glasses of wine per week    Comment: glass of wine nightly   Drug use: Never   Sexual activity: Not on file  Other Topics Concern   Not on file  Social History Narrative   Not on file   Social Determinants of Health   Financial Resource Strain: Not on file  Food Insecurity: Not on file  Transportation Needs: Not on file  Physical Activity: Not on file  Stress: Not on file  Social Connections: Not on file  Intimate Partner Violence: Not on file    ALLERGIES: Aldactone [spironolactone]  MEDICATIONS:  Current Outpatient Medications  Medication Sig Dispense Refill   amLODipine (NORVASC) 5 MG tablet Take 1 tablet (5 mg total) by mouth daily. 90 tablet 3   cloNIDine (CATAPRES - DOSED IN MG/24 HR) 0.2 mg/24hr patch APPLY 1 PATCH TO SKIN ONCE A WEEK 12 patch 3   dextroamphetamine (DEXTROSTAT) 10 MG tablet Take 10 mg by mouth 2 (two) times daily.     docusate sodium (COLACE) 100 MG capsule Take 1 capsule (100 mg total) by mouth 2 (two) times daily.     doxazosin (CARDURA) 8 MG tablet Take 1 tablet (8 mg total) by mouth at bedtime. 90 tablet 3   eplerenone (INSPRA) 50 MG tablet TAKE 1 TABLET DAILY 90 tablet 2   hydrALAZINE (APRESOLINE) 100 MG tablet TAKE 1 TABLET(100 MG) BY MOUTH THREE TIMES DAILY 90 tablet 2   olmesartan-hydrochlorothiazide (BENICAR HCT) 40-25 MG tablet Take 1 tablet by mouth daily. 90 tablet 3   Potassium 99 MG TABS Take 99 mg by mouth at bedtime.      rosuvastatin (CRESTOR) 20 MG tablet Take 20 mg by mouth daily.     zolpidem (AMBIEN) 10 MG tablet Take 5 mg by mouth at bedtime as needed for sleep.     No current facility-administered medications for this encounter.    REVIEW OF SYSTEMS:  On review of systems, the patient reports that he is doing well overall. He denies any chest pain, shortness of breath, cough, fevers, chills, night sweats, unintended weight changes. He denies any bowel disturbances, and denies abdominal pain, nausea or vomiting. He denies any new musculoskeletal or joint aches or pains. His IPSS was 6, indicating mild urinary symptoms and he reports only rare SUI at this point. His SHIM was 10, indicating he has moderate erectile dysfunction. A complete review of systems is obtained and is  otherwise negative.    PHYSICAL EXAM:  Wt Readings from Last 3 Encounters:  06/12/22 245 lb (111.1 kg)  05/16/22 245 lb (111.1 kg)  04/11/22 248 lb 8 oz (112.7 kg)   Temp Readings from Last 3 Encounters:  12/05/21 98 F (36.7 C) (Oral)  11/13/21 99 F (37.2 C) (Oral)  01/12/20 98 F (36.7 C)   BP Readings from Last 3 Encounters:  06/12/22 134/74  05/16/22 (!) 149/72  04/11/22 124/84   Pulse Readings from Last 3 Encounters:  06/12/22 75  05/16/22 71  04/11/22 64    /10  In general this is a well appearing Caucasian man in no acute distress. He's alert and oriented x4 and appropriate throughout the examination. Cardiopulmonary assessment is negative for acute distress, and he exhibits normal effort.     KPS = 100  100 - Normal; no complaints; no evidence of disease. 90   - Able to carry on normal activity; minor signs or symptoms of disease. 80   - Normal activity with effort; some signs or symptoms of disease. 64   - Cares for self; unable to carry on normal activity or to do active work. 60   - Requires occasional assistance, but is able to care for most of his personal needs. 50   - Requires considerable  assistance and frequent medical care. 62   - Disabled; requires special care and assistance. 60   - Severely disabled; hospital admission is indicated although death not imminent. 70   - Very sick; hospital admission necessary; active supportive treatment necessary. 10   - Moribund; fatal processes progressing rapidly. 0     - Dead  Karnofsky DA, Abelmann [place], Craver LS and Burchenal [facility] [phone]) The use of the nitrogen mustards in the palliative treatment of carcinoma: with particular reference to bronchogenic carcinoma Cancer 1 634-56  LABORATORY DATA:  Lab Results  Component Value Date   WBC 7.1 11/13/2021   HGB 12.6 (L) 12/05/2021   HCT 36.3 (L) 12/05/2021   MCV 86.1 11/13/2021   PLT 148 (L) 11/13/2021   Lab Results  Component Value Date   NA 136 12/05/2021   K 4.0 12/05/2021   CL 103 12/05/2021   CO2 25 12/05/2021   No results found for: "ALT", "AST", "GGT", "ALKPHOS", "BILITOT"   RADIOGRAPHY: NM PET (PSMA) SKULL TO MID THIGH  Result Date: 11/06/2022 CLINICAL DATA:  Post prostatectomy and lymphadenectomy in the past. Patient presented initially with Gleason 9 disease. Now with PSA recurrence. EXAM: NUCLEAR MEDICINE PET SKULL BASE TO THIGH TECHNIQUE: 9.45 mCi F18 Piflufolastat (Pylarify) was injected intravenously. Full-ring PET imaging was performed from the skull base to thigh after the radiotracer. CT data was obtained and used for attenuation correction and anatomic localization. COMPARISON:  Chest abdomen and pelvis CT of April of 2023 and bone scan evaluation performed on December 01, 2021 as well. FINDINGS: NECK No radiotracer activity in neck lymph nodes. Incidental CT finding: None. CHEST No radiotracer accumulation within mediastinal or hilar lymph nodes. No suspicious pulmonary nodules on the CT scan. Incidental CT finding: Aortic atherosclerosis. Coronary artery disease with three-vessel coronary artery calcifications. No adenopathy by size criteria in the chest. No  consolidation or pleural effusion. Airways are patent. ABDOMEN/PELVIS Prostate: Post prostatectomy. Urinary bladder activity extends to the pelvic floor. Urinary bladder is collapsed. Lymph nodes: RIGHT pelvic sidewall/hypogastric lymph node (image 206/4) 6 mm showing a maximum SUV 24.1. No additional PSMA avid lymph nodes in the abdomen or pelvis. Liver: No  evidence of liver metastasis. Incidental CT finding: Aortic atherosclerosis. Nephrolithiasis as on previous imaging. No acute findings in the abdomen or in the pelvis. Colonic diverticulosis. Bilateral fat containing inguinal hernias. SKELETON No focal activity to suggest skeletal metastasis. IMPRESSION: 1. PSMA avid RIGHT pelvic sidewall/hypogastric lymph node compatible with nodal metastasis. 2. No additional signs of metastatic disease on whole-body PET. 3. Post prostatectomy. 4. Nephrolithiasis. 5. Aortic atherosclerosis and coronary artery disease. Aortic Atherosclerosis (ICD10-I70.0). Electronically Signed   By: Zetta Bills M.D.   On: 11/06/2022 09:40      IMPRESSION/PLAN: 1. 67 y.o. gentleman with biochemically recurrent, oligometastatic prostate cancer with a postoperative PSA of 0.53 and solitary PSMA-avid pelvic node s/p RALP 11/2021 for Stage pT2N0, Gleason 4+5 prostate cancer. Today, we reviewed the findings and workup thus far.  We discussed the natural history of prostate cancer.  We reviewed the the implications of positive margins, extracapsular extension, and seminal vesicle involvement on the risk of prostate cancer recurrence. Despite there being no high-risk features on his surgical pathology, his Gleason score indicated high risk for recurrence and we now have evidence of residual disease and a solitary pelvic lymph node on recent PSMA PET scan. We reviewed some of the evidence suggesting an advantage for patients who undergo salvage radiotherapy in this setting in terms of disease control and overall survival. We discussed radiation  treatment directed to the prostatic fossa and pelvic lymph nodes with a boost to the involved lymph node with regard to the logistics and delivery of external beam radiation treatment.  We also discussed the role of ADT in the management of high risk, oligometastatic prostate cancer as well as the expected acute and late side effects associated with this treatment.  At the conclusion of our conversation, the patient is interested in moving forward with the recommended 7.5 week course of daily external beam therapy in combination with at least ST-ADT. We will share our discussion with Dr. Alinda Money and make arrangements for a follow-up visit in the urology office to start ADT, first available. The patient appears to have a good understanding of his disease and our treatment recommendations which are of curative intent and is in agreement with the stated plan.  He has freely signed written consent to proceed today in the office and a copy of this document will be placed in his medical record.  He is tentatively scheduled for CT simulation/treatment planning on Wednesday, 11/07/2022 at 8 AM, in anticipation of beginning his daily radiation treatments in the near future.   We personally spent 60 minutes in this encounter including chart review, reviewing radiological studies, meeting face-to-face with the patient, entering orders and completing documentation.    Nicholos Johns, PA-C    Tyler Pita, MD  [facility] Direct Dial: [phone]  Fax: [phone] [email or web address]  Skype  LinkedIn   This document serves as a record of services personally performed by Tyler Pita, MD and Freeman Caldron, PA-C. It was created on their behalf by Wilburn Mylar, a trained medical scribe. The creation of this record is based on the scribe's personal observations and the provider's statements to them. This document has been checked and approved by the attending provider.

## 2022-11-07 ENCOUNTER — Ambulatory Visit
Admission: RE | Admit: 2022-11-07 | Discharge: 2022-11-07 | Disposition: A | Discharge status: Home / Self Care | Payer: BC Managed Care – PPO | Source: Ambulatory Visit | Attending: Radiation Oncology | Admitting: Radiation Oncology

## 2022-11-07 DIAGNOSIS — C61 Malignant neoplasm of prostate: Secondary | ICD-10-CM | POA: Diagnosis not present

## 2022-11-07 DIAGNOSIS — Z191 Hormone sensitive malignancy status: Secondary | ICD-10-CM | POA: Diagnosis not present

## 2022-11-07 DIAGNOSIS — Z510 Encounter for antineoplastic radiation therapy: Secondary | ICD-10-CM | POA: Insufficient documentation

## 2022-11-07 NOTE — Progress Notes (Signed)
  Radiation Oncology         [phone] ________________________________  Name: Casey Hill MRN: [id number]  Date: 11/07/2022  DOB: 1956/03/07  SIMULATION AND TREATMENT PLANNING NOTE    ICD-10-CM   1. Prostate cancer [facility])  C61       DIAGNOSIS:  67 y.o. gentleman with biochemically recurrent, oligometastatic prostate cancer with a postoperative PSA of 0.53 and solitary PSMA-avid pelvic node s/p RALP 11/2021 for Stage pT2N0, Gleason 4+5 prostate cancer  NARRATIVE:  The patient was brought to the [facility].  Identity was confirmed.  All relevant records and images related to the planned course of therapy were reviewed.  The patient freely provided informed written consent to proceed with treatment after reviewing the details related to the planned course of therapy. The consent form was witnessed and verified by the simulation staff.  Then, the patient was set-up in a stable reproducible supine position for radiation therapy.  A vacuum lock pillow device was custom fabricated to position his legs in a reproducible immobilized position.  Then, I performed a urethrogram under sterile conditions to identify the prostatic bed.  CT images were obtained.  Surface markings were placed.  The CT images were loaded into the planning software.  Then the prostate bed target, pelvic lymph node target and avoidance structures including the rectum, bladder, bowel and hips were contoured.  Treatment planning then occurred.  The radiation prescription was entered and confirmed.  A total of one complex treatment devices were fabricated. I have requested : Intensity Modulated Radiotherapy (IMRT) is medically necessary for this case for the following reason:  Rectal sparing.Marland Kitchen  PLAN:  The patient will receive 45 Gy in 25 fractions of 1.8 Gy, followed by a boost to the prostate bed to a total dose of 68.4 Gy with 13 additional fractions of 1.8 Gy while the PET positive node is boosted to 73.6 Gy in  13 fractions of 2.2 Gy.   ________________________________  Sheral Apley Tammi Klippel, M.D.

## 2022-11-13 DIAGNOSIS — Z510 Encounter for antineoplastic radiation therapy: Secondary | ICD-10-CM | POA: Diagnosis not present

## 2022-11-13 DIAGNOSIS — Z191 Hormone sensitive malignancy status: Secondary | ICD-10-CM | POA: Diagnosis not present

## 2022-11-13 DIAGNOSIS — C61 Malignant neoplasm of prostate: Secondary | ICD-10-CM | POA: Diagnosis not present

## 2022-11-16 DIAGNOSIS — G4733 Obstructive sleep apnea (adult) (pediatric): Secondary | ICD-10-CM | POA: Diagnosis not present

## 2022-11-19 NOTE — Progress Notes (Signed)
Patient was presented at [facility] on 3/19 for his biochemically recurrent, oligometastatic prostate cancer with a postoperative PSA of 0.53 and solitary PSMA-avid pelvic node s/p RALP 11/2021 for Stage pT2N0, Gleason 4+5 prostate cancer.    Patient agreeable with recommendations of 7.5 week course of daily external beam therapy in combination with at least ST-ADT.   Patient is scheduled for Eligard @ [facility] on 4/2 and will being radiation treatment on 4/4.   Plan of care in progress.

## 2022-11-20 DIAGNOSIS — Z5111 Encounter for antineoplastic chemotherapy: Secondary | ICD-10-CM | POA: Diagnosis not present

## 2022-11-20 DIAGNOSIS — C61 Malignant neoplasm of prostate: Secondary | ICD-10-CM | POA: Diagnosis not present

## 2022-11-22 ENCOUNTER — Other Ambulatory Visit: Payer: Self-pay

## 2022-11-22 ENCOUNTER — Ambulatory Visit
Admission: RE | Admit: 2022-11-22 | Discharge: 2022-11-22 | Disposition: A | Discharge status: Home / Self Care | Payer: BC Managed Care – PPO | Source: Ambulatory Visit | Attending: Radiation Oncology | Admitting: Radiation Oncology

## 2022-11-22 DIAGNOSIS — Z510 Encounter for antineoplastic radiation therapy: Secondary | ICD-10-CM | POA: Diagnosis not present

## 2022-11-22 DIAGNOSIS — C61 Malignant neoplasm of prostate: Secondary | ICD-10-CM | POA: Diagnosis not present

## 2022-11-22 DIAGNOSIS — Z191 Hormone sensitive malignancy status: Secondary | ICD-10-CM | POA: Diagnosis not present

## 2022-11-22 LAB — RAD ONC ARIA SESSION SUMMARY
Course Elapsed Days: 0
Plan Fractions Treated to Date: 1
Plan Prescribed Dose Per Fraction: 1.8 Gy
Plan Total Fractions Prescribed: 25
Plan Total Prescribed Dose: 45 Gy
Reference Point Dosage Given to Date: 1.8 Gy
Reference Point Session Dosage Given: 1.8 Gy
Session Number: 1

## 2022-11-23 ENCOUNTER — Ambulatory Visit
Admission: RE | Admit: 2022-11-23 | Discharge: 2022-11-23 | Disposition: A | Discharge status: Home / Self Care | Payer: BC Managed Care – PPO | Source: Ambulatory Visit | Attending: Radiation Oncology | Admitting: Radiation Oncology

## 2022-11-23 ENCOUNTER — Other Ambulatory Visit: Payer: Self-pay

## 2022-11-23 DIAGNOSIS — Z510 Encounter for antineoplastic radiation therapy: Secondary | ICD-10-CM | POA: Diagnosis not present

## 2022-11-23 DIAGNOSIS — C61 Malignant neoplasm of prostate: Secondary | ICD-10-CM | POA: Diagnosis not present

## 2022-11-23 DIAGNOSIS — Z191 Hormone sensitive malignancy status: Secondary | ICD-10-CM | POA: Diagnosis not present

## 2022-11-23 LAB — RAD ONC ARIA SESSION SUMMARY
Course Elapsed Days: 1
Plan Fractions Treated to Date: 2
Plan Prescribed Dose Per Fraction: 1.8 Gy
Plan Total Fractions Prescribed: 25
Plan Total Prescribed Dose: 45 Gy
Reference Point Dosage Given to Date: 3.6 Gy
Reference Point Session Dosage Given: 1.8 Gy
Session Number: 2

## 2022-11-26 ENCOUNTER — Other Ambulatory Visit: Payer: Self-pay

## 2022-11-26 ENCOUNTER — Ambulatory Visit
Admission: RE | Admit: 2022-11-26 | Discharge: 2022-11-26 | Disposition: A | Discharge status: Home / Self Care | Payer: BC Managed Care – PPO | Source: Ambulatory Visit | Attending: Radiation Oncology | Admitting: Radiation Oncology

## 2022-11-26 ENCOUNTER — Other Ambulatory Visit (HOSPITAL_BASED_OUTPATIENT_CLINIC_OR_DEPARTMENT_OTHER): Payer: Self-pay | Admitting: Cardiovascular Disease

## 2022-11-26 DIAGNOSIS — Z510 Encounter for antineoplastic radiation therapy: Secondary | ICD-10-CM | POA: Diagnosis not present

## 2022-11-26 DIAGNOSIS — Z191 Hormone sensitive malignancy status: Secondary | ICD-10-CM | POA: Diagnosis not present

## 2022-11-26 DIAGNOSIS — C61 Malignant neoplasm of prostate: Secondary | ICD-10-CM | POA: Diagnosis not present

## 2022-11-26 LAB — RAD ONC ARIA SESSION SUMMARY
Course Elapsed Days: 4
Plan Fractions Treated to Date: 3
Plan Prescribed Dose Per Fraction: 1.8 Gy
Plan Total Fractions Prescribed: 25
Plan Total Prescribed Dose: 45 Gy
Reference Point Dosage Given to Date: 5.4 Gy
Reference Point Session Dosage Given: 1.8 Gy
Session Number: 3

## 2022-11-26 NOTE — Telephone Encounter (Signed)
Rx request sent to pharmacy.  

## 2022-11-27 ENCOUNTER — Ambulatory Visit
Admission: RE | Admit: 2022-11-27 | Discharge: 2022-11-27 | Disposition: A | Discharge status: Home / Self Care | Payer: BC Managed Care – PPO | Source: Ambulatory Visit | Attending: Radiation Oncology | Admitting: Radiation Oncology

## 2022-11-27 ENCOUNTER — Other Ambulatory Visit: Payer: Self-pay

## 2022-11-27 DIAGNOSIS — Z191 Hormone sensitive malignancy status: Secondary | ICD-10-CM | POA: Diagnosis not present

## 2022-11-27 DIAGNOSIS — C61 Malignant neoplasm of prostate: Secondary | ICD-10-CM | POA: Diagnosis not present

## 2022-11-27 DIAGNOSIS — Z510 Encounter for antineoplastic radiation therapy: Secondary | ICD-10-CM | POA: Diagnosis not present

## 2022-11-27 LAB — RAD ONC ARIA SESSION SUMMARY
Course Elapsed Days: 5
Plan Fractions Treated to Date: 4
Plan Prescribed Dose Per Fraction: 1.8 Gy
Plan Total Fractions Prescribed: 25
Plan Total Prescribed Dose: 45 Gy
Reference Point Dosage Given to Date: 7.2 Gy
Reference Point Session Dosage Given: 1.8 Gy
Session Number: 4

## 2022-11-28 ENCOUNTER — Other Ambulatory Visit: Payer: Self-pay

## 2022-11-28 ENCOUNTER — Ambulatory Visit
Admission: RE | Admit: 2022-11-28 | Discharge: 2022-11-28 | Disposition: A | Discharge status: Home / Self Care | Payer: BC Managed Care – PPO | Source: Ambulatory Visit | Attending: Radiation Oncology | Admitting: Radiation Oncology

## 2022-11-28 DIAGNOSIS — Z510 Encounter for antineoplastic radiation therapy: Secondary | ICD-10-CM | POA: Diagnosis not present

## 2022-11-28 DIAGNOSIS — C61 Malignant neoplasm of prostate: Secondary | ICD-10-CM | POA: Diagnosis not present

## 2022-11-28 DIAGNOSIS — Z191 Hormone sensitive malignancy status: Secondary | ICD-10-CM | POA: Diagnosis not present

## 2022-11-28 LAB — RAD ONC ARIA SESSION SUMMARY
Course Elapsed Days: 6
Plan Fractions Treated to Date: 5
Plan Prescribed Dose Per Fraction: 1.8 Gy
Plan Total Fractions Prescribed: 25
Plan Total Prescribed Dose: 45 Gy
Reference Point Dosage Given to Date: 9 Gy
Reference Point Session Dosage Given: 1.8 Gy
Session Number: 5

## 2022-11-29 ENCOUNTER — Ambulatory Visit: Payer: BC Managed Care – PPO

## 2022-11-29 ENCOUNTER — Ambulatory Visit
Admission: RE | Admit: 2022-11-29 | Discharge: 2022-11-29 | Disposition: A | Discharge status: Home / Self Care | Payer: BC Managed Care – PPO | Source: Ambulatory Visit | Attending: Radiation Oncology | Admitting: Radiation Oncology

## 2022-11-29 ENCOUNTER — Other Ambulatory Visit: Payer: Self-pay

## 2022-11-29 DIAGNOSIS — C61 Malignant neoplasm of prostate: Secondary | ICD-10-CM | POA: Diagnosis not present

## 2022-11-29 DIAGNOSIS — Z510 Encounter for antineoplastic radiation therapy: Secondary | ICD-10-CM | POA: Diagnosis not present

## 2022-11-29 DIAGNOSIS — Z191 Hormone sensitive malignancy status: Secondary | ICD-10-CM | POA: Diagnosis not present

## 2022-11-29 LAB — RAD ONC ARIA SESSION SUMMARY
Course Elapsed Days: 7
Plan Fractions Treated to Date: 6
Plan Prescribed Dose Per Fraction: 1.8 Gy
Plan Total Fractions Prescribed: 25
Plan Total Prescribed Dose: 45 Gy
Reference Point Dosage Given to Date: 10.8 Gy
Reference Point Session Dosage Given: 1.8 Gy
Session Number: 6

## 2022-11-30 ENCOUNTER — Ambulatory Visit
Admission: RE | Admit: 2022-11-30 | Discharge: 2022-11-30 | Disposition: A | Discharge status: Home / Self Care | Payer: BC Managed Care – PPO | Source: Ambulatory Visit | Attending: Radiation Oncology | Admitting: Radiation Oncology

## 2022-11-30 ENCOUNTER — Other Ambulatory Visit: Payer: Self-pay

## 2022-11-30 DIAGNOSIS — Z191 Hormone sensitive malignancy status: Secondary | ICD-10-CM | POA: Diagnosis not present

## 2022-11-30 DIAGNOSIS — C61 Malignant neoplasm of prostate: Secondary | ICD-10-CM | POA: Diagnosis not present

## 2022-11-30 DIAGNOSIS — Z510 Encounter for antineoplastic radiation therapy: Secondary | ICD-10-CM | POA: Diagnosis not present

## 2022-11-30 LAB — RAD ONC ARIA SESSION SUMMARY
Course Elapsed Days: 8
Plan Fractions Treated to Date: 7
Plan Prescribed Dose Per Fraction: 1.8 Gy
Plan Total Fractions Prescribed: 25
Plan Total Prescribed Dose: 45 Gy
Reference Point Dosage Given to Date: 12.6 Gy
Reference Point Session Dosage Given: 1.8 Gy
Session Number: 7

## 2022-12-03 ENCOUNTER — Other Ambulatory Visit: Payer: Self-pay

## 2022-12-03 ENCOUNTER — Ambulatory Visit
Admission: RE | Admit: 2022-12-03 | Discharge: 2022-12-03 | Disposition: A | Discharge status: Home / Self Care | Payer: BC Managed Care – PPO | Source: Ambulatory Visit | Attending: Radiation Oncology | Admitting: Radiation Oncology

## 2022-12-03 DIAGNOSIS — Z510 Encounter for antineoplastic radiation therapy: Secondary | ICD-10-CM | POA: Diagnosis not present

## 2022-12-03 DIAGNOSIS — C61 Malignant neoplasm of prostate: Secondary | ICD-10-CM | POA: Diagnosis not present

## 2022-12-03 DIAGNOSIS — Z191 Hormone sensitive malignancy status: Secondary | ICD-10-CM | POA: Diagnosis not present

## 2022-12-03 LAB — RAD ONC ARIA SESSION SUMMARY
Course Elapsed Days: 11
Plan Fractions Treated to Date: 8
Plan Prescribed Dose Per Fraction: 1.8 Gy
Plan Total Fractions Prescribed: 25
Plan Total Prescribed Dose: 45 Gy
Reference Point Dosage Given to Date: 14.4 Gy
Reference Point Session Dosage Given: 1.8 Gy
Session Number: 8

## 2022-12-04 ENCOUNTER — Other Ambulatory Visit: Payer: Self-pay

## 2022-12-04 ENCOUNTER — Ambulatory Visit
Admission: RE | Admit: 2022-12-04 | Discharge: 2022-12-04 | Disposition: A | Discharge status: Home / Self Care | Payer: BC Managed Care – PPO | Source: Ambulatory Visit | Attending: Radiation Oncology | Admitting: Radiation Oncology

## 2022-12-04 DIAGNOSIS — Z191 Hormone sensitive malignancy status: Secondary | ICD-10-CM | POA: Diagnosis not present

## 2022-12-04 DIAGNOSIS — Z510 Encounter for antineoplastic radiation therapy: Secondary | ICD-10-CM | POA: Diagnosis not present

## 2022-12-04 DIAGNOSIS — C61 Malignant neoplasm of prostate: Secondary | ICD-10-CM | POA: Diagnosis not present

## 2022-12-04 LAB — RAD ONC ARIA SESSION SUMMARY
Course Elapsed Days: 12
Plan Fractions Treated to Date: 9
Plan Prescribed Dose Per Fraction: 1.8 Gy
Plan Total Fractions Prescribed: 25
Plan Total Prescribed Dose: 45 Gy
Reference Point Dosage Given to Date: 16.2 Gy
Reference Point Session Dosage Given: 1.8 Gy
Session Number: 9

## 2022-12-05 ENCOUNTER — Ambulatory Visit
Admission: RE | Admit: 2022-12-05 | Discharge: 2022-12-05 | Disposition: A | Discharge status: Home / Self Care | Payer: BC Managed Care – PPO | Source: Ambulatory Visit | Attending: Radiation Oncology | Admitting: Radiation Oncology

## 2022-12-05 ENCOUNTER — Other Ambulatory Visit: Payer: Self-pay

## 2022-12-05 DIAGNOSIS — C61 Malignant neoplasm of prostate: Secondary | ICD-10-CM | POA: Diagnosis not present

## 2022-12-05 DIAGNOSIS — Z191 Hormone sensitive malignancy status: Secondary | ICD-10-CM | POA: Diagnosis not present

## 2022-12-05 DIAGNOSIS — Z510 Encounter for antineoplastic radiation therapy: Secondary | ICD-10-CM | POA: Diagnosis not present

## 2022-12-05 LAB — RAD ONC ARIA SESSION SUMMARY
Course Elapsed Days: 13
Plan Fractions Treated to Date: 10
Plan Prescribed Dose Per Fraction: 1.8 Gy
Plan Total Fractions Prescribed: 25
Plan Total Prescribed Dose: 45 Gy
Reference Point Dosage Given to Date: 18 Gy
Reference Point Session Dosage Given: 1.8 Gy
Session Number: 10

## 2022-12-06 ENCOUNTER — Ambulatory Visit
Admission: RE | Admit: 2022-12-06 | Discharge: 2022-12-06 | Disposition: A | Discharge status: Home / Self Care | Payer: BC Managed Care – PPO | Source: Ambulatory Visit | Attending: Radiation Oncology | Admitting: Radiation Oncology

## 2022-12-06 ENCOUNTER — Other Ambulatory Visit: Payer: Self-pay

## 2022-12-06 DIAGNOSIS — Z510 Encounter for antineoplastic radiation therapy: Secondary | ICD-10-CM | POA: Diagnosis not present

## 2022-12-06 DIAGNOSIS — C61 Malignant neoplasm of prostate: Secondary | ICD-10-CM | POA: Diagnosis not present

## 2022-12-06 DIAGNOSIS — Z191 Hormone sensitive malignancy status: Secondary | ICD-10-CM | POA: Diagnosis not present

## 2022-12-06 LAB — RAD ONC ARIA SESSION SUMMARY
Course Elapsed Days: 14
Plan Fractions Treated to Date: 11
Plan Prescribed Dose Per Fraction: 1.8 Gy
Plan Total Fractions Prescribed: 25
Plan Total Prescribed Dose: 45 Gy
Reference Point Dosage Given to Date: 19.8 Gy
Reference Point Session Dosage Given: 1.8 Gy
Session Number: 11

## 2022-12-07 ENCOUNTER — Ambulatory Visit
Admission: RE | Admit: 2022-12-07 | Discharge: 2022-12-07 | Disposition: A | Discharge status: Home / Self Care | Payer: BC Managed Care – PPO | Source: Ambulatory Visit | Attending: Radiation Oncology | Admitting: Radiation Oncology

## 2022-12-07 ENCOUNTER — Other Ambulatory Visit: Payer: Self-pay

## 2022-12-07 DIAGNOSIS — C61 Malignant neoplasm of prostate: Secondary | ICD-10-CM | POA: Diagnosis not present

## 2022-12-07 DIAGNOSIS — Z510 Encounter for antineoplastic radiation therapy: Secondary | ICD-10-CM | POA: Diagnosis not present

## 2022-12-07 DIAGNOSIS — Z191 Hormone sensitive malignancy status: Secondary | ICD-10-CM | POA: Diagnosis not present

## 2022-12-07 LAB — RAD ONC ARIA SESSION SUMMARY
Course Elapsed Days: 15
Plan Fractions Treated to Date: 12
Plan Prescribed Dose Per Fraction: 1.8 Gy
Plan Total Fractions Prescribed: 25
Plan Total Prescribed Dose: 45 Gy
Reference Point Dosage Given to Date: 21.6 Gy
Reference Point Session Dosage Given: 1.8 Gy
Session Number: 12

## 2022-12-10 ENCOUNTER — Other Ambulatory Visit: Payer: Self-pay

## 2022-12-10 ENCOUNTER — Ambulatory Visit
Admission: RE | Admit: 2022-12-10 | Discharge: 2022-12-10 | Disposition: A | Discharge status: Home / Self Care | Payer: BC Managed Care – PPO | Source: Ambulatory Visit | Attending: Radiation Oncology | Admitting: Radiation Oncology

## 2022-12-10 DIAGNOSIS — Z510 Encounter for antineoplastic radiation therapy: Secondary | ICD-10-CM | POA: Diagnosis not present

## 2022-12-10 DIAGNOSIS — C61 Malignant neoplasm of prostate: Secondary | ICD-10-CM | POA: Diagnosis not present

## 2022-12-10 DIAGNOSIS — Z191 Hormone sensitive malignancy status: Secondary | ICD-10-CM | POA: Diagnosis not present

## 2022-12-10 LAB — RAD ONC ARIA SESSION SUMMARY
Course Elapsed Days: 18
Plan Fractions Treated to Date: 13
Plan Prescribed Dose Per Fraction: 1.8 Gy
Plan Total Fractions Prescribed: 25
Plan Total Prescribed Dose: 45 Gy
Reference Point Dosage Given to Date: 23.4 Gy
Reference Point Session Dosage Given: 1.8 Gy
Session Number: 13

## 2022-12-11 ENCOUNTER — Other Ambulatory Visit: Payer: Self-pay

## 2022-12-11 ENCOUNTER — Ambulatory Visit
Admission: RE | Admit: 2022-12-11 | Discharge: 2022-12-11 | Disposition: A | Discharge status: Home / Self Care | Payer: BC Managed Care – PPO | Source: Ambulatory Visit | Attending: Radiation Oncology | Admitting: Radiation Oncology

## 2022-12-11 DIAGNOSIS — C61 Malignant neoplasm of prostate: Secondary | ICD-10-CM | POA: Diagnosis not present

## 2022-12-11 DIAGNOSIS — Z191 Hormone sensitive malignancy status: Secondary | ICD-10-CM | POA: Diagnosis not present

## 2022-12-11 DIAGNOSIS — Z510 Encounter for antineoplastic radiation therapy: Secondary | ICD-10-CM | POA: Diagnosis not present

## 2022-12-11 LAB — RAD ONC ARIA SESSION SUMMARY
Course Elapsed Days: 19
Plan Fractions Treated to Date: 14
Plan Prescribed Dose Per Fraction: 1.8 Gy
Plan Total Fractions Prescribed: 25
Plan Total Prescribed Dose: 45 Gy
Reference Point Dosage Given to Date: 25.2 Gy
Reference Point Session Dosage Given: 1.8 Gy
Session Number: 14

## 2022-12-12 ENCOUNTER — Other Ambulatory Visit: Payer: Self-pay

## 2022-12-12 ENCOUNTER — Ambulatory Visit
Admission: RE | Admit: 2022-12-12 | Discharge: 2022-12-12 | Disposition: A | Discharge status: Home / Self Care | Payer: BC Managed Care – PPO | Source: Ambulatory Visit | Attending: Radiation Oncology | Admitting: Radiation Oncology

## 2022-12-12 DIAGNOSIS — C61 Malignant neoplasm of prostate: Secondary | ICD-10-CM | POA: Diagnosis not present

## 2022-12-12 DIAGNOSIS — Z510 Encounter for antineoplastic radiation therapy: Secondary | ICD-10-CM | POA: Diagnosis not present

## 2022-12-12 DIAGNOSIS — Z191 Hormone sensitive malignancy status: Secondary | ICD-10-CM | POA: Diagnosis not present

## 2022-12-12 LAB — RAD ONC ARIA SESSION SUMMARY
Course Elapsed Days: 20
Plan Fractions Treated to Date: 15
Plan Prescribed Dose Per Fraction: 1.8 Gy
Plan Total Fractions Prescribed: 25
Plan Total Prescribed Dose: 45 Gy
Reference Point Dosage Given to Date: 27 Gy
Reference Point Session Dosage Given: 1.8 Gy
Session Number: 15

## 2022-12-13 ENCOUNTER — Other Ambulatory Visit: Payer: Self-pay

## 2022-12-13 ENCOUNTER — Ambulatory Visit
Admission: RE | Admit: 2022-12-13 | Discharge: 2022-12-13 | Disposition: A | Discharge status: Home / Self Care | Payer: BC Managed Care – PPO | Source: Ambulatory Visit | Attending: Radiation Oncology | Admitting: Radiation Oncology

## 2022-12-13 DIAGNOSIS — C61 Malignant neoplasm of prostate: Secondary | ICD-10-CM | POA: Diagnosis not present

## 2022-12-13 DIAGNOSIS — Z510 Encounter for antineoplastic radiation therapy: Secondary | ICD-10-CM | POA: Diagnosis not present

## 2022-12-13 DIAGNOSIS — Z191 Hormone sensitive malignancy status: Secondary | ICD-10-CM | POA: Diagnosis not present

## 2022-12-13 LAB — RAD ONC ARIA SESSION SUMMARY
Course Elapsed Days: 21
Plan Fractions Treated to Date: 16
Plan Prescribed Dose Per Fraction: 1.8 Gy
Plan Total Fractions Prescribed: 25
Plan Total Prescribed Dose: 45 Gy
Reference Point Dosage Given to Date: 28.8 Gy
Reference Point Session Dosage Given: 1.8 Gy
Session Number: 16

## 2022-12-14 ENCOUNTER — Ambulatory Visit
Admission: RE | Admit: 2022-12-14 | Discharge: 2022-12-14 | Disposition: A | Discharge status: Home / Self Care | Payer: BC Managed Care – PPO | Source: Ambulatory Visit | Attending: Radiation Oncology | Admitting: Radiation Oncology

## 2022-12-14 ENCOUNTER — Other Ambulatory Visit: Payer: Self-pay

## 2022-12-14 DIAGNOSIS — Z191 Hormone sensitive malignancy status: Secondary | ICD-10-CM | POA: Diagnosis not present

## 2022-12-14 DIAGNOSIS — C61 Malignant neoplasm of prostate: Secondary | ICD-10-CM | POA: Diagnosis not present

## 2022-12-14 DIAGNOSIS — Z510 Encounter for antineoplastic radiation therapy: Secondary | ICD-10-CM | POA: Diagnosis not present

## 2022-12-14 LAB — RAD ONC ARIA SESSION SUMMARY
Course Elapsed Days: 22
Plan Fractions Treated to Date: 17
Plan Prescribed Dose Per Fraction: 1.8 Gy
Plan Total Fractions Prescribed: 25
Plan Total Prescribed Dose: 45 Gy
Reference Point Dosage Given to Date: 30.6 Gy
Reference Point Session Dosage Given: 1.8 Gy
Session Number: 17

## 2022-12-17 ENCOUNTER — Other Ambulatory Visit: Payer: Self-pay

## 2022-12-17 ENCOUNTER — Ambulatory Visit
Admission: RE | Admit: 2022-12-17 | Discharge: 2022-12-17 | Disposition: A | Discharge status: Home / Self Care | Payer: BC Managed Care – PPO | Source: Ambulatory Visit | Attending: Radiation Oncology | Admitting: Radiation Oncology

## 2022-12-17 DIAGNOSIS — C61 Malignant neoplasm of prostate: Secondary | ICD-10-CM | POA: Diagnosis not present

## 2022-12-17 DIAGNOSIS — Z510 Encounter for antineoplastic radiation therapy: Secondary | ICD-10-CM | POA: Diagnosis not present

## 2022-12-17 DIAGNOSIS — Z191 Hormone sensitive malignancy status: Secondary | ICD-10-CM | POA: Diagnosis not present

## 2022-12-17 LAB — RAD ONC ARIA SESSION SUMMARY
Course Elapsed Days: 25
Plan Fractions Treated to Date: 18
Plan Prescribed Dose Per Fraction: 1.8 Gy
Plan Total Fractions Prescribed: 25
Plan Total Prescribed Dose: 45 Gy
Reference Point Dosage Given to Date: 32.4 Gy
Reference Point Session Dosage Given: 1.8 Gy
Session Number: 18

## 2022-12-18 ENCOUNTER — Ambulatory Visit
Admission: RE | Admit: 2022-12-18 | Discharge: 2022-12-18 | Disposition: A | Discharge status: Home / Self Care | Payer: BC Managed Care – PPO | Source: Ambulatory Visit | Attending: Radiation Oncology | Admitting: Radiation Oncology

## 2022-12-18 ENCOUNTER — Other Ambulatory Visit: Payer: Self-pay

## 2022-12-18 DIAGNOSIS — Z191 Hormone sensitive malignancy status: Secondary | ICD-10-CM | POA: Diagnosis not present

## 2022-12-18 DIAGNOSIS — Z510 Encounter for antineoplastic radiation therapy: Secondary | ICD-10-CM | POA: Diagnosis not present

## 2022-12-18 DIAGNOSIS — C61 Malignant neoplasm of prostate: Secondary | ICD-10-CM | POA: Diagnosis not present

## 2022-12-18 LAB — RAD ONC ARIA SESSION SUMMARY
Course Elapsed Days: 26
Plan Fractions Treated to Date: 19
Plan Prescribed Dose Per Fraction: 1.8 Gy
Plan Total Fractions Prescribed: 25
Plan Total Prescribed Dose: 45 Gy
Reference Point Dosage Given to Date: 34.2 Gy
Reference Point Session Dosage Given: 1.8 Gy
Session Number: 19

## 2022-12-19 ENCOUNTER — Other Ambulatory Visit: Payer: Self-pay

## 2022-12-19 ENCOUNTER — Ambulatory Visit
Admission: RE | Admit: 2022-12-19 | Discharge: 2022-12-19 | Disposition: A | Discharge status: Home / Self Care | Payer: BC Managed Care – PPO | Source: Ambulatory Visit | Attending: Radiation Oncology | Admitting: Radiation Oncology

## 2022-12-19 DIAGNOSIS — Z191 Hormone sensitive malignancy status: Secondary | ICD-10-CM | POA: Diagnosis not present

## 2022-12-19 DIAGNOSIS — C61 Malignant neoplasm of prostate: Secondary | ICD-10-CM | POA: Diagnosis not present

## 2022-12-19 DIAGNOSIS — Z510 Encounter for antineoplastic radiation therapy: Secondary | ICD-10-CM | POA: Diagnosis not present

## 2022-12-19 LAB — RAD ONC ARIA SESSION SUMMARY
Course Elapsed Days: 27
Plan Fractions Treated to Date: 20
Plan Prescribed Dose Per Fraction: 1.8 Gy
Plan Total Fractions Prescribed: 25
Plan Total Prescribed Dose: 45 Gy
Reference Point Dosage Given to Date: 36 Gy
Reference Point Session Dosage Given: 1.8 Gy
Session Number: 20

## 2022-12-20 ENCOUNTER — Ambulatory Visit
Admission: RE | Admit: 2022-12-20 | Discharge: 2022-12-20 | Disposition: A | Discharge status: Home / Self Care | Payer: BC Managed Care – PPO | Source: Ambulatory Visit | Attending: Radiation Oncology | Admitting: Radiation Oncology

## 2022-12-20 ENCOUNTER — Other Ambulatory Visit: Payer: Self-pay

## 2022-12-20 DIAGNOSIS — Z510 Encounter for antineoplastic radiation therapy: Secondary | ICD-10-CM | POA: Diagnosis not present

## 2022-12-20 DIAGNOSIS — Z191 Hormone sensitive malignancy status: Secondary | ICD-10-CM | POA: Diagnosis not present

## 2022-12-20 DIAGNOSIS — C61 Malignant neoplasm of prostate: Secondary | ICD-10-CM | POA: Diagnosis not present

## 2022-12-20 LAB — RAD ONC ARIA SESSION SUMMARY
Course Elapsed Days: 28
Plan Fractions Treated to Date: 21
Plan Prescribed Dose Per Fraction: 1.8 Gy
Plan Total Fractions Prescribed: 25
Plan Total Prescribed Dose: 45 Gy
Reference Point Dosage Given to Date: 37.8 Gy
Reference Point Session Dosage Given: 1.8 Gy
Session Number: 21

## 2022-12-21 ENCOUNTER — Other Ambulatory Visit: Payer: Self-pay

## 2022-12-21 ENCOUNTER — Ambulatory Visit
Admission: RE | Admit: 2022-12-21 | Discharge: 2022-12-21 | Disposition: A | Discharge status: Home / Self Care | Payer: BC Managed Care – PPO | Source: Ambulatory Visit | Attending: Radiation Oncology | Admitting: Radiation Oncology

## 2022-12-21 DIAGNOSIS — Z191 Hormone sensitive malignancy status: Secondary | ICD-10-CM | POA: Diagnosis not present

## 2022-12-21 DIAGNOSIS — C61 Malignant neoplasm of prostate: Secondary | ICD-10-CM | POA: Diagnosis not present

## 2022-12-21 DIAGNOSIS — Z510 Encounter for antineoplastic radiation therapy: Secondary | ICD-10-CM | POA: Diagnosis not present

## 2022-12-21 LAB — RAD ONC ARIA SESSION SUMMARY
Course Elapsed Days: 29
Plan Fractions Treated to Date: 22
Plan Prescribed Dose Per Fraction: 1.8 Gy
Plan Total Fractions Prescribed: 25
Plan Total Prescribed Dose: 45 Gy
Reference Point Dosage Given to Date: 39.6 Gy
Reference Point Session Dosage Given: 1.8 Gy
Session Number: 22

## 2022-12-24 ENCOUNTER — Other Ambulatory Visit: Payer: Self-pay

## 2022-12-24 ENCOUNTER — Ambulatory Visit
Admission: RE | Admit: 2022-12-24 | Discharge: 2022-12-24 | Disposition: A | Discharge status: Home / Self Care | Payer: BC Managed Care – PPO | Source: Ambulatory Visit | Attending: Radiation Oncology | Admitting: Radiation Oncology

## 2022-12-24 DIAGNOSIS — Z191 Hormone sensitive malignancy status: Secondary | ICD-10-CM | POA: Diagnosis not present

## 2022-12-24 DIAGNOSIS — Z510 Encounter for antineoplastic radiation therapy: Secondary | ICD-10-CM | POA: Diagnosis not present

## 2022-12-24 DIAGNOSIS — C61 Malignant neoplasm of prostate: Secondary | ICD-10-CM | POA: Diagnosis not present

## 2022-12-24 LAB — RAD ONC ARIA SESSION SUMMARY
Course Elapsed Days: 32
Plan Fractions Treated to Date: 23
Plan Prescribed Dose Per Fraction: 1.8 Gy
Plan Total Fractions Prescribed: 25
Plan Total Prescribed Dose: 45 Gy
Reference Point Dosage Given to Date: 41.4 Gy
Reference Point Session Dosage Given: 1.8 Gy
Session Number: 23

## 2022-12-25 ENCOUNTER — Other Ambulatory Visit: Payer: Self-pay

## 2022-12-25 ENCOUNTER — Ambulatory Visit
Admission: RE | Admit: 2022-12-25 | Discharge: 2022-12-25 | Disposition: A | Discharge status: Home / Self Care | Payer: BC Managed Care – PPO | Source: Ambulatory Visit | Attending: Radiation Oncology | Admitting: Radiation Oncology

## 2022-12-25 DIAGNOSIS — Z510 Encounter for antineoplastic radiation therapy: Secondary | ICD-10-CM | POA: Diagnosis not present

## 2022-12-25 DIAGNOSIS — Z191 Hormone sensitive malignancy status: Secondary | ICD-10-CM | POA: Diagnosis not present

## 2022-12-25 DIAGNOSIS — C61 Malignant neoplasm of prostate: Secondary | ICD-10-CM | POA: Diagnosis not present

## 2022-12-25 LAB — RAD ONC ARIA SESSION SUMMARY
Course Elapsed Days: 33
Plan Fractions Treated to Date: 24
Plan Prescribed Dose Per Fraction: 1.8 Gy
Plan Total Fractions Prescribed: 25
Plan Total Prescribed Dose: 45 Gy
Reference Point Dosage Given to Date: 43.2 Gy
Reference Point Session Dosage Given: 1.8 Gy
Session Number: 24

## 2022-12-26 ENCOUNTER — Other Ambulatory Visit: Payer: Self-pay

## 2022-12-26 ENCOUNTER — Ambulatory Visit
Admission: RE | Admit: 2022-12-26 | Discharge: 2022-12-26 | Disposition: A | Discharge status: Home / Self Care | Payer: BC Managed Care – PPO | Source: Ambulatory Visit | Attending: Radiation Oncology | Admitting: Radiation Oncology

## 2022-12-26 DIAGNOSIS — C61 Malignant neoplasm of prostate: Secondary | ICD-10-CM | POA: Diagnosis not present

## 2022-12-26 DIAGNOSIS — Z191 Hormone sensitive malignancy status: Secondary | ICD-10-CM | POA: Diagnosis not present

## 2022-12-26 DIAGNOSIS — Z510 Encounter for antineoplastic radiation therapy: Secondary | ICD-10-CM | POA: Diagnosis not present

## 2022-12-26 LAB — RAD ONC ARIA SESSION SUMMARY
Course Elapsed Days: 34
Plan Fractions Treated to Date: 25
Plan Prescribed Dose Per Fraction: 1.8 Gy
Plan Total Fractions Prescribed: 25
Plan Total Prescribed Dose: 45 Gy
Reference Point Dosage Given to Date: 45 Gy
Reference Point Session Dosage Given: 1.8 Gy
Session Number: 25

## 2022-12-27 ENCOUNTER — Other Ambulatory Visit: Payer: Self-pay

## 2022-12-27 ENCOUNTER — Ambulatory Visit
Admission: RE | Admit: 2022-12-27 | Discharge: 2022-12-27 | Disposition: A | Discharge status: Home / Self Care | Payer: BC Managed Care – PPO | Source: Ambulatory Visit | Attending: Radiation Oncology | Admitting: Radiation Oncology

## 2022-12-27 DIAGNOSIS — C61 Malignant neoplasm of prostate: Secondary | ICD-10-CM | POA: Diagnosis not present

## 2022-12-27 DIAGNOSIS — Z191 Hormone sensitive malignancy status: Secondary | ICD-10-CM | POA: Diagnosis not present

## 2022-12-27 DIAGNOSIS — Z510 Encounter for antineoplastic radiation therapy: Secondary | ICD-10-CM | POA: Diagnosis not present

## 2022-12-27 LAB — RAD ONC ARIA SESSION SUMMARY
Course Elapsed Days: 35
Plan Fractions Treated to Date: 1
Plan Prescribed Dose Per Fraction: 1.8 Gy
Plan Total Fractions Prescribed: 13
Plan Total Prescribed Dose: 23.4 Gy
Reference Point Dosage Given to Date: 1.8 Gy
Reference Point Session Dosage Given: 1.8 Gy
Session Number: 26

## 2022-12-28 ENCOUNTER — Ambulatory Visit
Admission: RE | Admit: 2022-12-28 | Discharge: 2022-12-28 | Disposition: A | Discharge status: Home / Self Care | Payer: BC Managed Care – PPO | Source: Ambulatory Visit | Attending: Radiation Oncology | Admitting: Radiation Oncology

## 2022-12-28 ENCOUNTER — Other Ambulatory Visit: Payer: Self-pay

## 2022-12-28 DIAGNOSIS — Z191 Hormone sensitive malignancy status: Secondary | ICD-10-CM | POA: Diagnosis not present

## 2022-12-28 DIAGNOSIS — C61 Malignant neoplasm of prostate: Secondary | ICD-10-CM | POA: Diagnosis not present

## 2022-12-28 DIAGNOSIS — Z510 Encounter for antineoplastic radiation therapy: Secondary | ICD-10-CM | POA: Diagnosis not present

## 2022-12-28 LAB — RAD ONC ARIA SESSION SUMMARY
Course Elapsed Days: 36
Plan Fractions Treated to Date: 2
Plan Prescribed Dose Per Fraction: 1.8 Gy
Plan Total Fractions Prescribed: 13
Plan Total Prescribed Dose: 23.4 Gy
Reference Point Dosage Given to Date: 3.6 Gy
Reference Point Session Dosage Given: 1.8 Gy
Session Number: 27

## 2022-12-31 ENCOUNTER — Ambulatory Visit
Admission: RE | Admit: 2022-12-31 | Discharge: 2022-12-31 | Disposition: A | Discharge status: Home / Self Care | Payer: BC Managed Care – PPO | Source: Ambulatory Visit | Attending: Radiation Oncology | Admitting: Radiation Oncology

## 2022-12-31 ENCOUNTER — Other Ambulatory Visit: Payer: Self-pay

## 2022-12-31 DIAGNOSIS — Z510 Encounter for antineoplastic radiation therapy: Secondary | ICD-10-CM | POA: Diagnosis not present

## 2022-12-31 DIAGNOSIS — C61 Malignant neoplasm of prostate: Secondary | ICD-10-CM | POA: Diagnosis not present

## 2022-12-31 DIAGNOSIS — Z191 Hormone sensitive malignancy status: Secondary | ICD-10-CM | POA: Diagnosis not present

## 2022-12-31 LAB — RAD ONC ARIA SESSION SUMMARY
Course Elapsed Days: 39
Plan Fractions Treated to Date: 3
Plan Prescribed Dose Per Fraction: 1.8 Gy
Plan Total Fractions Prescribed: 13
Plan Total Prescribed Dose: 23.4 Gy
Reference Point Dosage Given to Date: 5.4 Gy
Reference Point Session Dosage Given: 1.8 Gy
Session Number: 28

## 2023-01-01 ENCOUNTER — Ambulatory Visit
Admission: RE | Admit: 2023-01-01 | Discharge: 2023-01-01 | Disposition: A | Discharge status: Home / Self Care | Payer: BC Managed Care – PPO | Source: Ambulatory Visit | Attending: Radiation Oncology | Admitting: Radiation Oncology

## 2023-01-01 ENCOUNTER — Other Ambulatory Visit: Payer: Self-pay

## 2023-01-01 DIAGNOSIS — C61 Malignant neoplasm of prostate: Secondary | ICD-10-CM | POA: Diagnosis not present

## 2023-01-01 DIAGNOSIS — Z191 Hormone sensitive malignancy status: Secondary | ICD-10-CM | POA: Diagnosis not present

## 2023-01-01 DIAGNOSIS — Z510 Encounter for antineoplastic radiation therapy: Secondary | ICD-10-CM | POA: Diagnosis not present

## 2023-01-01 LAB — RAD ONC ARIA SESSION SUMMARY
Course Elapsed Days: 40
Plan Fractions Treated to Date: 4
Plan Prescribed Dose Per Fraction: 1.8 Gy
Plan Total Fractions Prescribed: 13
Plan Total Prescribed Dose: 23.4 Gy
Reference Point Dosage Given to Date: 7.2 Gy
Reference Point Session Dosage Given: 1.8 Gy
Session Number: 29

## 2023-01-02 ENCOUNTER — Other Ambulatory Visit: Payer: Self-pay

## 2023-01-02 ENCOUNTER — Ambulatory Visit
Admission: RE | Admit: 2023-01-02 | Discharge: 2023-01-02 | Disposition: A | Discharge status: Home / Self Care | Payer: BC Managed Care – PPO | Source: Ambulatory Visit | Attending: Radiation Oncology | Admitting: Radiation Oncology

## 2023-01-02 DIAGNOSIS — C61 Malignant neoplasm of prostate: Secondary | ICD-10-CM | POA: Diagnosis not present

## 2023-01-02 DIAGNOSIS — Z191 Hormone sensitive malignancy status: Secondary | ICD-10-CM | POA: Diagnosis not present

## 2023-01-02 DIAGNOSIS — Z510 Encounter for antineoplastic radiation therapy: Secondary | ICD-10-CM | POA: Diagnosis not present

## 2023-01-02 LAB — RAD ONC ARIA SESSION SUMMARY
Course Elapsed Days: 41
Plan Fractions Treated to Date: 5
Plan Prescribed Dose Per Fraction: 1.8 Gy
Plan Total Fractions Prescribed: 13
Plan Total Prescribed Dose: 23.4 Gy
Reference Point Dosage Given to Date: 9 Gy
Reference Point Session Dosage Given: 1.8 Gy
Session Number: 30

## 2023-01-03 ENCOUNTER — Ambulatory Visit
Admission: RE | Admit: 2023-01-03 | Discharge: 2023-01-03 | Disposition: A | Discharge status: Home / Self Care | Payer: BC Managed Care – PPO | Source: Ambulatory Visit | Attending: Radiation Oncology | Admitting: Radiation Oncology

## 2023-01-03 ENCOUNTER — Other Ambulatory Visit: Payer: Self-pay

## 2023-01-03 DIAGNOSIS — Z510 Encounter for antineoplastic radiation therapy: Secondary | ICD-10-CM | POA: Diagnosis not present

## 2023-01-03 DIAGNOSIS — Z191 Hormone sensitive malignancy status: Secondary | ICD-10-CM | POA: Diagnosis not present

## 2023-01-03 DIAGNOSIS — C61 Malignant neoplasm of prostate: Secondary | ICD-10-CM | POA: Diagnosis not present

## 2023-01-03 LAB — RAD ONC ARIA SESSION SUMMARY
Course Elapsed Days: 42
Plan Fractions Treated to Date: 6
Plan Prescribed Dose Per Fraction: 1.8 Gy
Plan Total Fractions Prescribed: 13
Plan Total Prescribed Dose: 23.4 Gy
Reference Point Dosage Given to Date: 10.8 Gy
Reference Point Session Dosage Given: 1.8 Gy
Session Number: 31

## 2023-01-04 ENCOUNTER — Other Ambulatory Visit: Payer: Self-pay

## 2023-01-04 ENCOUNTER — Ambulatory Visit
Admission: RE | Admit: 2023-01-04 | Discharge: 2023-01-04 | Disposition: A | Discharge status: Home / Self Care | Payer: BC Managed Care – PPO | Source: Ambulatory Visit | Attending: Radiation Oncology | Admitting: Radiation Oncology

## 2023-01-04 DIAGNOSIS — Z191 Hormone sensitive malignancy status: Secondary | ICD-10-CM | POA: Diagnosis not present

## 2023-01-04 DIAGNOSIS — Z510 Encounter for antineoplastic radiation therapy: Secondary | ICD-10-CM | POA: Diagnosis not present

## 2023-01-04 DIAGNOSIS — C61 Malignant neoplasm of prostate: Secondary | ICD-10-CM | POA: Diagnosis not present

## 2023-01-04 LAB — RAD ONC ARIA SESSION SUMMARY
Course Elapsed Days: 43
Plan Fractions Treated to Date: 7
Plan Prescribed Dose Per Fraction: 1.8 Gy
Plan Total Fractions Prescribed: 13
Plan Total Prescribed Dose: 23.4 Gy
Reference Point Dosage Given to Date: 12.6 Gy
Reference Point Session Dosage Given: 1.8 Gy
Session Number: 32

## 2023-01-07 ENCOUNTER — Other Ambulatory Visit: Payer: Self-pay

## 2023-01-07 ENCOUNTER — Ambulatory Visit
Admission: RE | Admit: 2023-01-07 | Discharge: 2023-01-07 | Disposition: A | Discharge status: Home / Self Care | Payer: BC Managed Care – PPO | Source: Ambulatory Visit | Attending: Radiation Oncology | Admitting: Radiation Oncology

## 2023-01-07 DIAGNOSIS — Z191 Hormone sensitive malignancy status: Secondary | ICD-10-CM | POA: Diagnosis not present

## 2023-01-07 DIAGNOSIS — C61 Malignant neoplasm of prostate: Secondary | ICD-10-CM | POA: Diagnosis not present

## 2023-01-07 DIAGNOSIS — Z510 Encounter for antineoplastic radiation therapy: Secondary | ICD-10-CM | POA: Diagnosis not present

## 2023-01-07 LAB — RAD ONC ARIA SESSION SUMMARY
Course Elapsed Days: 46
Plan Fractions Treated to Date: 8
Plan Prescribed Dose Per Fraction: 1.8 Gy
Plan Total Fractions Prescribed: 13
Plan Total Prescribed Dose: 23.4 Gy
Reference Point Dosage Given to Date: 14.4 Gy
Reference Point Session Dosage Given: 1.8 Gy
Session Number: 33

## 2023-01-08 ENCOUNTER — Ambulatory Visit
Admission: RE | Admit: 2023-01-08 | Discharge: 2023-01-08 | Disposition: A | Discharge status: Home / Self Care | Payer: BC Managed Care – PPO | Source: Ambulatory Visit | Attending: Radiation Oncology | Admitting: Radiation Oncology

## 2023-01-08 ENCOUNTER — Other Ambulatory Visit: Payer: Self-pay

## 2023-01-08 DIAGNOSIS — Z191 Hormone sensitive malignancy status: Secondary | ICD-10-CM | POA: Diagnosis not present

## 2023-01-08 DIAGNOSIS — Z510 Encounter for antineoplastic radiation therapy: Secondary | ICD-10-CM | POA: Diagnosis not present

## 2023-01-08 DIAGNOSIS — C61 Malignant neoplasm of prostate: Secondary | ICD-10-CM | POA: Diagnosis not present

## 2023-01-08 LAB — RAD ONC ARIA SESSION SUMMARY
Course Elapsed Days: 47
Plan Fractions Treated to Date: 9
Plan Prescribed Dose Per Fraction: 1.8 Gy
Plan Total Fractions Prescribed: 13
Plan Total Prescribed Dose: 23.4 Gy
Reference Point Dosage Given to Date: 16.2 Gy
Reference Point Session Dosage Given: 1.8 Gy
Session Number: 34

## 2023-01-09 ENCOUNTER — Ambulatory Visit
Admission: RE | Admit: 2023-01-09 | Discharge: 2023-01-09 | Disposition: A | Discharge status: Home / Self Care | Payer: BC Managed Care – PPO | Source: Ambulatory Visit | Attending: Radiation Oncology | Admitting: Radiation Oncology

## 2023-01-09 ENCOUNTER — Other Ambulatory Visit: Payer: Self-pay

## 2023-01-09 DIAGNOSIS — Z510 Encounter for antineoplastic radiation therapy: Secondary | ICD-10-CM | POA: Diagnosis not present

## 2023-01-09 DIAGNOSIS — C61 Malignant neoplasm of prostate: Secondary | ICD-10-CM | POA: Diagnosis not present

## 2023-01-09 DIAGNOSIS — Z191 Hormone sensitive malignancy status: Secondary | ICD-10-CM | POA: Diagnosis not present

## 2023-01-09 LAB — RAD ONC ARIA SESSION SUMMARY
Course Elapsed Days: 48
Plan Fractions Treated to Date: 10
Plan Prescribed Dose Per Fraction: 1.8 Gy
Plan Total Fractions Prescribed: 13
Plan Total Prescribed Dose: 23.4 Gy
Reference Point Dosage Given to Date: 18 Gy
Reference Point Session Dosage Given: 1.8 Gy
Session Number: 35

## 2023-01-10 ENCOUNTER — Ambulatory Visit
Admission: RE | Admit: 2023-01-10 | Discharge: 2023-01-10 | Disposition: A | Discharge status: Home / Self Care | Payer: BC Managed Care – PPO | Source: Ambulatory Visit | Attending: Radiation Oncology | Admitting: Radiation Oncology

## 2023-01-10 ENCOUNTER — Other Ambulatory Visit: Payer: Self-pay

## 2023-01-10 DIAGNOSIS — Z191 Hormone sensitive malignancy status: Secondary | ICD-10-CM | POA: Diagnosis not present

## 2023-01-10 DIAGNOSIS — Z510 Encounter for antineoplastic radiation therapy: Secondary | ICD-10-CM | POA: Diagnosis not present

## 2023-01-10 DIAGNOSIS — C61 Malignant neoplasm of prostate: Secondary | ICD-10-CM | POA: Diagnosis not present

## 2023-01-10 LAB — RAD ONC ARIA SESSION SUMMARY
Course Elapsed Days: 49
Plan Fractions Treated to Date: 11
Plan Prescribed Dose Per Fraction: 1.8 Gy
Plan Total Fractions Prescribed: 13
Plan Total Prescribed Dose: 23.4 Gy
Reference Point Dosage Given to Date: 19.8 Gy
Reference Point Session Dosage Given: 1.8 Gy
Session Number: 36

## 2023-01-11 ENCOUNTER — Ambulatory Visit
Admission: RE | Admit: 2023-01-11 | Discharge: 2023-01-11 | Disposition: A | Discharge status: Home / Self Care | Payer: BC Managed Care – PPO | Source: Ambulatory Visit | Attending: Radiation Oncology | Admitting: Radiation Oncology

## 2023-01-11 ENCOUNTER — Other Ambulatory Visit: Payer: Self-pay

## 2023-01-11 DIAGNOSIS — C61 Malignant neoplasm of prostate: Secondary | ICD-10-CM | POA: Diagnosis not present

## 2023-01-11 DIAGNOSIS — Z191 Hormone sensitive malignancy status: Secondary | ICD-10-CM | POA: Diagnosis not present

## 2023-01-11 DIAGNOSIS — Z510 Encounter for antineoplastic radiation therapy: Secondary | ICD-10-CM | POA: Diagnosis not present

## 2023-01-11 LAB — RAD ONC ARIA SESSION SUMMARY
Course Elapsed Days: 50
Plan Fractions Treated to Date: 12
Plan Prescribed Dose Per Fraction: 1.8 Gy
Plan Total Fractions Prescribed: 13
Plan Total Prescribed Dose: 23.4 Gy
Reference Point Dosage Given to Date: 21.6 Gy
Reference Point Session Dosage Given: 1.8 Gy
Session Number: 37

## 2023-01-15 ENCOUNTER — Other Ambulatory Visit: Payer: Self-pay

## 2023-01-15 ENCOUNTER — Ambulatory Visit
Admission: RE | Admit: 2023-01-15 | Discharge: 2023-01-15 | Disposition: A | Discharge status: Home / Self Care | Payer: BC Managed Care – PPO | Source: Ambulatory Visit | Attending: Radiation Oncology | Admitting: Radiation Oncology

## 2023-01-15 DIAGNOSIS — Z510 Encounter for antineoplastic radiation therapy: Secondary | ICD-10-CM | POA: Diagnosis not present

## 2023-01-15 DIAGNOSIS — C61 Malignant neoplasm of prostate: Secondary | ICD-10-CM | POA: Diagnosis not present

## 2023-01-15 DIAGNOSIS — Z191 Hormone sensitive malignancy status: Secondary | ICD-10-CM | POA: Diagnosis not present

## 2023-01-15 LAB — RAD ONC ARIA SESSION SUMMARY
Course Elapsed Days: 54
Plan Fractions Treated to Date: 13
Plan Prescribed Dose Per Fraction: 1.8 Gy
Plan Total Fractions Prescribed: 13
Plan Total Prescribed Dose: 23.4 Gy
Reference Point Dosage Given to Date: 23.4 Gy
Reference Point Session Dosage Given: 1.8 Gy
Session Number: 38

## 2023-01-21 NOTE — Progress Notes (Signed)
Patient was presented at the [facility] on 11/06/22 for his  biochemically recurrent, oligometastatic prostate cancer with a postoperative PSA of 0.53 and solitary PSMA-avid pelvic node s/p RALP 11/2021 for Stage pT2N0, Gleason 4+5 prostate cancer.  Patient proceed with treatment recommendations of 7.5 week course of daily external beam therapy in combination with at least ST-ADT and had his final radiation treatment on 01/15/23.   Patient is scheduled for a post treatment nurse call on 02/26/23 and has his first post treatment PSA on 05/31/23 at [facility].

## 2023-01-22 NOTE — Radiation Completion Notes (Addendum)
  Radiation Oncology         [phone] ________________________________  Name: Casey Hill MRN: [id number]  Date: 01/15/2023  DOB: 1956/08/09  Referring Physician: Crecencio Mc, M.D. Date of Service: 2023-01-22 Radiation Oncologist: Margaretmary Bayley, M.D. [facility]     RADIATION ONCOLOGY END OF TREATMENT NOTE     Diagnosis:  67 y.o. gentleman with biochemically recurrent, oligometastatic prostate cancer with a postoperative PSA of 0.53 and solitary PSMA-avid pelvic node s/p RALP 11/2021 for Stage pT2N0, Gleason 4+5 prostate cancer   Intent: Curative     ==========DELIVERED PLANS==========  First Treatment Date: 2022-11-22 - Last Treatment Date: 2023-01-15   Plan Name: ProstBed_Pelv Site: Prostate Bed Technique: IMRT Mode: Photon Dose Per Fraction: 1.8 Gy Prescribed Dose (Delivered / Prescribed): 45 Gy / 45 Gy Prescribed Fxs (Delivered / Prescribed): 25 / 25   Plan Name: ProstBed_Bst Site: Prostate Bed Technique: IMRT Mode: Photon Dose Per Fraction: 1.8 Gy Prescribed Dose (Delivered / Prescribed): 23.4 Gy / 23.4 Gy Prescribed Fxs (Delivered / Prescribed): 13 / 13     ==========ON TREATMENT VISIT DATES========== 2022-11-23, 2022-11-29, 2022-12-07, 2022-12-14, 2022-12-21, 2022-12-27, 2023-01-04, 2023-01-10   See weekly On Treatment Notes in Epic for details.  He tolerated the radiation treatments relatively well with minor urinary symptoms and modest fatigue.  The patient will receive a call in about one month from the radiation oncology department. He will continue follow up with Dr. Laverle Patter as well.  ------------------------------------------------   Margaretmary Dys, MD [facility]  Radiation Oncology Direct Dial: [phone]  Fax: [phone] [email or web address]  Skype  LinkedIn

## 2023-01-30 ENCOUNTER — Other Ambulatory Visit: Payer: Self-pay | Admitting: Cardiovascular Disease

## 2023-01-30 ENCOUNTER — Telehealth: Payer: Self-pay | Admitting: Cardiovascular Disease

## 2023-01-30 MED ORDER — OLMESARTAN MEDOXOMIL-HCTZ 40-25 MG PO TABS: 1.0000 | ORAL_TABLET | Freq: Every day | ORAL | 0 refills | Status: DC

## 2023-01-30 NOTE — Telephone Encounter (Signed)
*  STAT* If patient is at the pharmacy, call can be transferred to refill team.   1. Which medications need to be refilled? (please list name of each medication and dose if known) olmesartan-hydrochlorothiazide (BENICAR HCT) 40-25 MG tablet  2. Which pharmacy/location (including street and city if local pharmacy) is medication to be sent to? [id number] - [place] - [address]  3. Do they need a 30 day or 90 day supply?  30 day supply

## 2023-01-30 NOTE — Telephone Encounter (Signed)
Rx request sent to pharmacy.  

## 2023-02-13 DIAGNOSIS — G4734 Idiopathic sleep related nonobstructive alveolar hypoventilation: Secondary | ICD-10-CM | POA: Diagnosis not present

## 2023-02-13 DIAGNOSIS — G4733 Obstructive sleep apnea (adult) (pediatric): Secondary | ICD-10-CM | POA: Diagnosis not present

## 2023-02-20 NOTE — Progress Notes (Signed)
  Radiation Oncology         [phone] ________________________________  Name: Casey Hill MRN: [id number]  Date of Service: 02/26/2023  DOB: 02/24/1956  Post Treatment Telephone Note  Diagnosis:  C61 Malignant neoplasm of prostate (as documented in provider EOT note)   Pre Treatment IPSS Score: 6 (as documented in the provider consult note)   The patient was available for call today.   Symptoms of fatigue have improved since completing therapy.  Symptoms of bladder changes have improved since completing therapy. Current symptoms include none, and medications for bladder symptoms include none.  Symptoms of bowel changes have improved since completing therapy. Current symptoms include none, and medications for bowel symptoms include none.     Post Treatment IPSS Score: IPSS Questionnaire (AUA-7): Over the past month.   1)  How often have you had a sensation of not emptying your bladder completely after you finish urinating?  0 - Not at all  2)  How often have you had to urinate again less than two hours after you finished urinating? 0 - Not at all  3)  How often have you found you stopped and started again several times when you urinated?  0 - Not at all  4) How difficult have you found it to postpone urination?  0 - Not at all  5) How often have you had a weak urinary stream?  1 - Less than 1 time in 5  6) How often have you had to push or strain to begin urination?  0 - Not at all  7) How many times did you most typically get up to urinate from the time you went to bed until the time you got up in the morning?  2 - 2 times  Total score:  3. Which indicates mild symptoms  0-7 mildly symptomatic   8-19 moderately symptomatic   20-35 severely symptomatic    Patient (has a scheduled follow up visit with his urologist, Dr. Laverle Patter, on 05/2023 for ongoing surveillance. He was counseled that PSA levels will be drawn in the urology office, and was reassured that additional time  is expected to improve bowel and bladder symptoms. He was encouraged to call back with concerns or questions regarding radiation.   This concludes the interaction.  Ruel Favors, LPN

## 2023-02-26 ENCOUNTER — Ambulatory Visit
Admission: RE | Admit: 2023-02-26 | Discharge: 2023-02-26 | Disposition: A | Discharge status: Home / Self Care | Payer: BC Managed Care – PPO | Source: Ambulatory Visit | Attending: Radiation Oncology | Admitting: Radiation Oncology

## 2023-03-01 ENCOUNTER — Telehealth (HOSPITAL_BASED_OUTPATIENT_CLINIC_OR_DEPARTMENT_OTHER): Payer: Self-pay

## 2023-03-01 NOTE — Telephone Encounter (Signed)
Patient sent mychart scheduling request and then requested to be called.   Called patient at their request, patient states that he has been having tingling in his toes. When asking the patient why he believed this is a cardiac problem. He states that he had a scan about 6 months ago and was told if he started having symptoms down in his legs or feet that he would need to be seen. He states that the has also called his PCP and requested to be seen by them. States if he is seen by them first he will cancel this appointment. Patient scheduled with Gillian Shields, NP [phone]

## 2023-03-05 DIAGNOSIS — I10 Essential (primary) hypertension: Secondary | ICD-10-CM | POA: Diagnosis not present

## 2023-03-05 DIAGNOSIS — M545 Low back pain, unspecified: Secondary | ICD-10-CM | POA: Diagnosis not present

## 2023-03-05 DIAGNOSIS — R200 Anesthesia of skin: Secondary | ICD-10-CM | POA: Diagnosis not present

## 2023-03-05 DIAGNOSIS — F909 Attention-deficit hyperactivity disorder, unspecified type: Secondary | ICD-10-CM | POA: Diagnosis not present

## 2023-03-06 ENCOUNTER — Other Ambulatory Visit: Payer: Self-pay | Admitting: Internal Medicine

## 2023-03-06 ENCOUNTER — Ambulatory Visit
Admission: RE | Admit: 2023-03-06 | Discharge: 2023-03-06 | Disposition: A | Discharge status: Home / Self Care | Payer: BC Managed Care – PPO | Source: Ambulatory Visit | Attending: Internal Medicine | Admitting: Internal Medicine

## 2023-03-06 DIAGNOSIS — M545 Low back pain, unspecified: Secondary | ICD-10-CM | POA: Diagnosis not present

## 2023-03-22 ENCOUNTER — Ambulatory Visit (HOSPITAL_BASED_OUTPATIENT_CLINIC_OR_DEPARTMENT_OTHER): Payer: BC Managed Care – PPO | Admitting: Family

## 2023-03-26 ENCOUNTER — Other Ambulatory Visit (HOSPITAL_BASED_OUTPATIENT_CLINIC_OR_DEPARTMENT_OTHER): Payer: Self-pay | Admitting: Cardiovascular Disease

## 2023-03-26 ENCOUNTER — Ambulatory Visit (HOSPITAL_BASED_OUTPATIENT_CLINIC_OR_DEPARTMENT_OTHER): Payer: BC Managed Care – PPO | Admitting: Family

## 2023-03-26 NOTE — Telephone Encounter (Signed)
Rx request sent to pharmacy.  

## 2023-04-17 DIAGNOSIS — L570 Actinic keratosis: Secondary | ICD-10-CM | POA: Diagnosis not present

## 2023-04-17 DIAGNOSIS — D225 Melanocytic nevi of trunk: Secondary | ICD-10-CM | POA: Diagnosis not present

## 2023-04-17 DIAGNOSIS — Z85828 Personal history of other malignant neoplasm of skin: Secondary | ICD-10-CM | POA: Diagnosis not present

## 2023-04-17 DIAGNOSIS — L821 Other seborrheic keratosis: Secondary | ICD-10-CM | POA: Diagnosis not present

## 2023-05-06 ENCOUNTER — Other Ambulatory Visit: Payer: Self-pay | Admitting: Cardiovascular Disease

## 2023-05-16 ENCOUNTER — Other Ambulatory Visit (HOSPITAL_BASED_OUTPATIENT_CLINIC_OR_DEPARTMENT_OTHER): Payer: Self-pay | Admitting: Cardiovascular Disease

## 2023-05-30 ENCOUNTER — Telehealth: Payer: Self-pay | Admitting: Cardiovascular Disease

## 2023-05-30 MED ORDER — EPLERENONE 50 MG PO TABS: 50.0000 mg | ORAL_TABLET | Freq: Every day | ORAL | 0 refills | Status: AC

## 2023-05-30 MED ORDER — AMLODIPINE BESYLATE 5 MG PO TABS: 5.0000 mg | ORAL_TABLET | Freq: Every day | ORAL | 0 refills | Status: DC

## 2023-05-30 MED ORDER — DOXAZOSIN MESYLATE 8 MG PO TABS: 8.0000 mg | ORAL_TABLET | Freq: Every day | ORAL | 0 refills | Status: AC

## 2023-05-30 NOTE — Telephone Encounter (Signed)
*  STAT* If patient is at the pharmacy, call can be transferred to refill team.   1. Which medications need to be refilled? (please list name of each medication and dose if known) eplerenone (INSPRA) 50 MG tablet ;  doxazosin (CARDURA) 8 MG tablet ; amLODipine (NORVASC) 5 MG tablet   2. Would you like to learn more about the convenience, safety, & potential cost savings by using the [facility]?     3. Are you open to using the [facility] (Type [facility]. No  ).   4. Which pharmacy/location (including street and city if local pharmacy) is medication to be sent to? [facility] - [place], [place] - [address]    5. Do they need a 30 day or 90 day supply? 90

## 2023-06-07 DIAGNOSIS — C61 Malignant neoplasm of prostate: Secondary | ICD-10-CM | POA: Diagnosis not present

## 2023-06-20 NOTE — Progress Notes (Signed)
Virtual Visit via Video Note   Because of Casey Hill's co-morbid illnesses, he is at least at moderate risk for complications without adequate follow up.  This format is felt to be most appropriate for this patient at this time.  All issues noted in this document were discussed and addressed.  A limited physical exam was performed with this format.  Please refer to the patient's chart for his consent to telehealth for [name].   Date:  07/01/2023   ID:  Casey Hill, DOB 07/04/1956, MRN [id number]  Patient Location: Home Provider Location: Office  PCP:  Emilio Aspen, MD  Cardiologist:  Chilton Si, MD  Electrophysiologist:  None   Evaluation Performed:  Follow-Up Visit  Chief Complaint:  follow-up of hypertension  History of Present Illness:    Casey Hill is a 67 y.o. male with hypertension, hyperlipidemia, GERD, obstructive sleep apnea, ADD, and prostate cancer who is followed by Dr. Duke Salvia and presents today for routine follow-up.   Patient is followed by Dr. Duke Salvia primarily for hypertension. Renal artery ultrasound in 11/2019 was negative for renal artery stenosis. He was last seen by Dr. Duke Salvia for a virtual visit in 05/2022 at which time he BP was well controlled after increasing Hydralazine. He does have a history of obstructive sleep apnea and reported not tolerating his CPAP mask well.   Patient presents today for routine follow-up. This is being done as a virtual visit given he has moved to [place]. He states he is currently in the process of trying to find new physicians. He has found a new PCP but cannot get in to see them until 12/2023. He has not found a Cardiologist in [facility] yet.   He states he is doing well from a cardiac standpoint. He denies any chest pain. He reports some fatigue/ decreased exercise tolerance since starting androgen therapy for his prostate cancer but his Urologist said this should improve. He is  also done with his androgen therapy. He denies any significant shortness of breath. No orthopnea, PND or edema. No palpitations. He reports occasional very brief lightheadedness/ dizziness with quick position changes but no falls or syncope. He is tolerating his CPAP better. He is compliant with his medications and is tolerating them well.  Prior CV studies:    The following studies were reviewed:  Renal Artery Ultrasound 11/25/2019: Summary: Renal:  - Right: No evidence of right renal artery stenosis. RRV flow present. Cyst(s) noted. Normal size right kidney. Normal right Resisitive Index. Normal cortical thickness of right kidney.  - Left:  No evidence of left renal artery stenosis. LRV flow present. Normal size of left kidney. Normal left Resistive Index. Normal cortical thickness of the left kidney.  - Mesenteric: Normal Celiac artery and Superior Mesenteric artery findings.    Labs/Other Tests and Data Reviewed:    EKG:  Last EKG from 04/11/2022 personally reviewed and demonstrates: Normal sinus rhythm, rate 64 bpm, with RBBB and non-specific T wave changes.   Recent Labs: No results found for requested labs within last 365 days.   Recent Lipid Panel No results found for: "CHOL", "TRIG", "HDL", "CHOLHDL", "LDLCALC", "LDLDIRECT"  Wt Readings from Last 3 Encounters:  07/01/23 245 lb (111.1 kg)  11/06/22 249 lb 3.2 oz (113 kg)  06/12/22 245 lb (111.1 kg)     Objective:    Vital Signs:  BP 135/81   Ht 5' 11.5" (1.816 m)   Wt 245 lb (111.1 kg)  BMI 33.69 kg/m    Vital Signs Reviewed. General: No acute distress. Pulm: No labored breathing. No coughing during visit. No audible wheezing. Speaking in full sentences. Neuro: Alert and oriented. No slurred speech. Answers questions appropriately. Psych: Pleasant affect.  ASSESSMENT & PLAN:    Hypertension BP relatively well controlled.  - Continue current medications: Amlodipine 5mg  daily, Olmesartan-HCTZ 40-25mg  daily,  Eplerenone 50mg  daily, Hydralazine 100mg  three times daily, Cardura 8mg  daily, and Clonidine patch 0.2mg  once a week.   Hyperlipidemia Most recent lipid panel from 09/2021 (PCP's office): Total Cholesterol 141, Triglycerides 131, HDL 47, LDL 76.  - Continue Crestor 20mg  daily.  - Labs followed by PCP.  Obstructive Sleep Apnea  - Continue CPAP.  Of note, patient has moved to [place], [place] and is looking for a Cardiologist there. He asked if Dr. Duke Salvia had an recommendations for a Cardiologist there. I will ask her.  Time:   Today, I have spent 8 minutes and 16 seconds with the patient with telehealth technology discussing the above problems.     Medication Adjustments/Labs and Tests Ordered: Current medicines are reviewed at length with the patient today.  Concerns regarding medicines are outlined above.   Follow Up:  PRN (patient has moved to [place], [place])  Signed, Corrin Parker, [place]  07/01/2023 3:58 PM    [facility]

## 2023-07-01 ENCOUNTER — Ambulatory Visit: Payer: BC Managed Care – PPO | Attending: Cardiology | Admitting: Student

## 2023-07-01 ENCOUNTER — Encounter: Payer: Self-pay | Admitting: Student

## 2023-07-01 VITALS — BP 135/81 | Ht 71.5 in | Wt 245.0 lb

## 2023-07-01 DIAGNOSIS — G4733 Obstructive sleep apnea (adult) (pediatric): Secondary | ICD-10-CM | POA: Diagnosis not present

## 2023-07-01 DIAGNOSIS — I10 Essential (primary) hypertension: Secondary | ICD-10-CM | POA: Diagnosis not present

## 2023-07-01 DIAGNOSIS — E785 Hyperlipidemia, unspecified: Secondary | ICD-10-CM

## 2023-07-01 NOTE — Patient Instructions (Signed)
Medication Instructions:  No Changes *If you need a refill on your cardiac medications before your next appointment, please call your pharmacy*   Lab Work: No Labs If you have labs (blood work) drawn today and your tests are completely normal, you will receive your results only by: MyChart Message (if you have MyChart) OR A paper copy in the mail If you have any lab test that is abnormal or we need to change your treatment, we will call you to review the results.   Testing/Procedures: No Testing   Follow-Up: At [facility], you and your health needs are our priority.  As part of our continuing mission to provide you with exceptional heart care, we have created designated Provider Care Teams.  These Care Teams include your primary Cardiologist (physician) and Advanced Practice Providers (APPs -  Physician Assistants and Nurse Practitioners) who all work together to provide you with the care you need, when you need it.  We recommend signing up for the patient portal called "MyChart".  Sign up information is provided on this After Visit Summary.  MyChart is used to connect with patients for Virtual Visits (Telemedicine).  Patients are able to view lab/test results, encounter notes, upcoming appointments, etc.  Non-urgent messages can be sent to your provider as well.   To learn more about what you can do with MyChart, go to [email or web address].    Your next appointment:   Follow Up As Needed  Provider:   Chilton Si, MD

## 2023-07-31 ENCOUNTER — Other Ambulatory Visit (HOSPITAL_BASED_OUTPATIENT_CLINIC_OR_DEPARTMENT_OTHER): Payer: Self-pay | Admitting: Cardiovascular Disease

## 2023-07-31 ENCOUNTER — Other Ambulatory Visit: Payer: Self-pay | Admitting: Cardiovascular Disease

## 2023-07-31 MED ORDER — OLMESARTAN MEDOXOMIL-HCTZ 40-25 MG PO TABS: 1.0000 | ORAL_TABLET | Freq: Every day | ORAL | 3 refills | Status: AC

## 2023-07-31 MED ORDER — AMLODIPINE BESYLATE 5 MG PO TABS: 5.0000 mg | ORAL_TABLET | Freq: Every day | ORAL | 3 refills | Status: AC

## 2023-07-31 NOTE — Telephone Encounter (Signed)
Medication escribed to pharmacy. 

## 2023-09-09 DIAGNOSIS — H538 Other visual disturbances: Secondary | ICD-10-CM | POA: Diagnosis not present

## 2023-09-16 DIAGNOSIS — C61 Malignant neoplasm of prostate: Secondary | ICD-10-CM | POA: Diagnosis not present

## 2023-09-16 DIAGNOSIS — R310 Gross hematuria: Secondary | ICD-10-CM | POA: Diagnosis not present

## 2023-09-24 DIAGNOSIS — C61 Malignant neoplasm of prostate: Secondary | ICD-10-CM | POA: Diagnosis not present

## 2023-09-24 DIAGNOSIS — R310 Gross hematuria: Secondary | ICD-10-CM | POA: Diagnosis not present

## 2023-09-25 DIAGNOSIS — R310 Gross hematuria: Secondary | ICD-10-CM | POA: Diagnosis not present

## 2023-09-30 DIAGNOSIS — C678 Malignant neoplasm of overlapping sites of bladder: Secondary | ICD-10-CM | POA: Diagnosis not present
